# Patient Record
Sex: Female | Born: 1985 | Race: White | Hispanic: No | Marital: Married | Smoking: Never smoker
Health system: Southern US, Community
[De-identification: ages and names within clinical notes are randomized; demographics above are authoritative.]

## PROBLEM LIST (undated history)

## (undated) DIAGNOSIS — E039 Hypothyroidism, unspecified: Secondary | ICD-10-CM

## (undated) HISTORY — PX: TONSILLECTOMY: SUR1361

## (undated) HISTORY — PX: INDUCED ABORTION: SHX677

---

## 2008-12-15 ENCOUNTER — Ambulatory Visit: Payer: Self-pay

## 2011-06-30 ENCOUNTER — Emergency Department: Payer: Self-pay | Admitting: Emergency Medicine

## 2011-06-30 LAB — COMPREHENSIVE METABOLIC PANEL
Alkaline Phosphatase: 51 U/L (ref 50–136)
BUN: 14 mg/dL (ref 7–18)
Bilirubin,Total: 0.4 mg/dL (ref 0.2–1.0)
Calcium, Total: 9.3 mg/dL (ref 8.5–10.1)
Co2: 29 mmol/L (ref 21–32)
EGFR (African American): >60
EGFR (Non-African Amer.): >60
Glucose: 103 mg/dL — ABNORMAL HIGH (ref 65–99)
Potassium: 3.7 mmol/L (ref 3.5–5.1)
SGOT(AST): 31 U/L (ref 15–37)
Sodium: 142 mmol/L (ref 136–145)
Total Protein: 8.6 g/dL — ABNORMAL HIGH (ref 6.4–8.2)

## 2011-06-30 LAB — CBC
HCT: 40 % (ref 35.0–47.0)
MCH: 31.3 pg (ref 26.0–34.0)
MCHC: 33.7 g/dL (ref 32.0–36.0)
MCV: 93 fL (ref 80–100)
Platelet: 264 10*3/uL (ref 150–440)
RDW: 13 % (ref 11.5–14.5)
WBC: 13.3 10*3/uL — ABNORMAL HIGH (ref 3.6–11.0)

## 2011-06-30 LAB — URINALYSIS, COMPLETE
Bacteria: NONE SEEN
Bilirubin,UR: NEGATIVE
Blood: NEGATIVE
Glucose,UR: NEGATIVE mg/dL (ref 0–75)
Leukocyte Esterase: NEGATIVE
Nitrite: NEGATIVE
Protein: NEGATIVE
Specific Gravity: 1.026 (ref 1.003–1.030)

## 2014-07-10 ENCOUNTER — Other Ambulatory Visit: Payer: Self-pay | Admitting: Family Medicine

## 2014-07-10 ENCOUNTER — Ambulatory Visit
Admission: RE | Admit: 2014-07-10 | Discharge: 2014-07-10 | Disposition: A | Discharge status: Home / Self Care | Payer: No Typology Code available for payment source | Source: Ambulatory Visit | Attending: Family Medicine | Admitting: Family Medicine

## 2014-07-10 DIAGNOSIS — M5489 Other dorsalgia: Secondary | ICD-10-CM

## 2014-08-04 ENCOUNTER — Other Ambulatory Visit: Payer: Self-pay | Admitting: Family Medicine

## 2014-08-04 DIAGNOSIS — M546 Pain in thoracic spine: Secondary | ICD-10-CM

## 2014-08-12 ENCOUNTER — Ambulatory Visit
Admission: RE | Admit: 2014-08-12 | Discharge: 2014-08-12 | Disposition: A | Discharge status: Home / Self Care | Payer: No Typology Code available for payment source | Source: Ambulatory Visit | Attending: Family Medicine | Admitting: Family Medicine

## 2014-08-12 DIAGNOSIS — M546 Pain in thoracic spine: Secondary | ICD-10-CM

## 2014-08-24 ENCOUNTER — Encounter: Payer: Self-pay | Admitting: Physical Therapy

## 2014-08-24 ENCOUNTER — Ambulatory Visit: Payer: No Typology Code available for payment source | Attending: Family Medicine | Admitting: Physical Therapy

## 2014-08-24 DIAGNOSIS — M546 Pain in thoracic spine: Secondary | ICD-10-CM | POA: Insufficient documentation
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
## 2014-08-24 NOTE — Patient Instructions (Addendum)
Seated Spiral Stretch   Sit with straight back, crossing left leg over bent right leg. Hold left knee with right hand and spiral to left. Support position with left hand on floor. Hold for _30__seconds. Repeat on other side. 2 times 2 times per day ADVANCED: Grasp heel of right foot.  Copyright  VHI. All rights reserved.  Patient able to return demonstration correctly

## 2014-08-26 ENCOUNTER — Encounter: Payer: Self-pay | Admitting: Physical Therapy

## 2014-08-26 ENCOUNTER — Ambulatory Visit: Payer: No Typology Code available for payment source | Admitting: Physical Therapy

## 2014-08-26 DIAGNOSIS — M546 Pain in thoracic spine: Secondary | ICD-10-CM | POA: Diagnosis not present

## 2014-08-26 NOTE — Therapy (Signed)
[facility] [address], [address] [place], [place], [address] Phone: [phone]   Fax:  [phone]  Physical Therapy Treatment  Patient Details  Name: Katherine Graham MRN: [id number] Date of Birth: 1986-02-19 Referring Provider:  Farris HasMorrow, Aaron, MD  Encounter Date: 08/26/2014      PT End of Session - 08/26/14 1309    Visit Number 2   Date for PT Re-Evaluation 09/21/14   PT Start Time 1233   PT Stop Time 1315   PT Time Calculation (min) 42 min   Activity Tolerance Patient tolerated treatment well   Behavior During Therapy [facility] for tasks assessed/performed      History reviewed. No pertinent past medical history.  History reviewed. No pertinent past surgical history.  There were no vitals filed for this visit.  Visit Diagnosis:  Left-sided thoracic back pain      Subjective Assessment - 08/26/14 1236    Subjective More pain ( slightly) since doing stretches.    Currently in Pain? Yes   Pain Score 3    Pain Location Back   Pain Orientation Left;Lower;Mid   Pain Descriptors / Indicators Sore   Pain Type Chronic pain   Aggravating Factors  Leaning left, laying on stomach   Pain Relieving Factors Laying on back                         [facility] Treatment/Exercise - 08/26/14 0001    Lumbar Exercises: Supine   Ab Set --  TA contractions 10x                PT Education - 08/26/14 1254    Education provided Yes   Education Details HEP. posture, to try eliptical at gym, no free wts, light mackines ok if she uses her abs.   Person(s) Educated Patient   Methods Explanation;Demonstration;Tactile cues;Verbal cues;Handout   Comprehension Verbalized understanding;Returned demonstration             PT Long Term Goals - 08/26/14 1312    PT LONG TERM GOAL #1   Title sleep with pain decreased >/= 75%   Time 4   Period Weeks   Status New  First tx day.    PT LONG TERM GOAL #2    Title sit erect without pain due to improved mobility of the thoracic spine   Period Weeks   Status New   PT LONG TERM GOAL #3   Title full thoracic motion for rotation and sidebending   Time 4   Status New               Plan - 08/26/14 1310    Clinical Impression Statement Pt educated in posture in standing and sitting at school, discussed MRI resluts and the meaning behind. Inititated thoracic extension in standing with sheet for HEP and to do at work.    Pt will benefit from skilled therapeutic intervention in order to improve on the following deficits Decreased range of motion;Impaired flexibility;Postural dysfunction;Decreased activity tolerance;Increased fascial restricitons;Pain;Increased muscle spasms;Decreased mobility   PT Frequency 2x / week   PT Duration 4 weeks   PT Treatment/Interventions Moist Heat;Therapeutic activities;Patient/family education;Ultrasound;Electrical Stimulation;Neuromuscular re-education;Manual techniques;Therapeutic exercise;Cryotherapy;Passive range of motion;ADLs/Self Care Home Management   PT Next Visit Plan Review HEP and postural concepts, begin gentle thoarcic stabs.    Consulted and Agree with Plan of Care Patient        Problem List There are no active  problems to display for this patient.   Samyra Limb, PTA 08/26/2014, 1:14 PM  [facility] [address], [address] [place], [place], [address] Phone: [phone]   Fax:  [phone]

## 2014-08-26 NOTE — Patient Instructions (Signed)
   Take long towel or sheet, in standing, place towel around tight or painful area on back. Pull sheet forward as you gently arch back and look to the sky. DO 5x 1-3 x day. Marland Kitchen.    Posture Tips DO: - stand tall and erect - keep chin tucked in - keep head and shoulders in alignment - check posture regularly in mirror or large window - pull head back against headrest in car seat;  Change your position often.  Sit with lumbar support. DON'T: - slouch or slump while watching TV or reading - sit, stand or lie in one position  for too long;  Sitting is especially hard on the spine so if you sit at a desk/use the computer, then stand up often!   Copyright  VHI. All rights reserved.  Posture - Standing   Good posture is important. Avoid slouching and forward head thrust. Maintain curve in low back and align ears over shoul- ders, hips over ankles.  Pull your belly button in toward your back bone.   Copyright  VHI. All rights reserved.  Posture - Sitting   Sit upright, head facing forward. Try using a roll to support lower back. Keep shoulders relaxed, and avoid rounded back. Keep hips level with knees. Avoid crossing legs for long periods.   Copyright  VHI. All rights reserved.   Copyright  VHI. All rights reserved.

## 2014-09-02 ENCOUNTER — Ambulatory Visit: Payer: No Typology Code available for payment source

## 2014-09-02 ENCOUNTER — Encounter: Payer: No Typology Code available for payment source | Admitting: Physical Therapy

## 2014-09-02 DIAGNOSIS — M546 Pain in thoracic spine: Secondary | ICD-10-CM

## 2014-09-02 DIAGNOSIS — R29898 Other symptoms and signs involving the musculoskeletal system: Secondary | ICD-10-CM

## 2014-09-02 NOTE — Therapy (Signed)
[facility] [address], [address] [place], [place], [address] Phone: [phone]   Fax:  [phone]  Physical Therapy Treatment  Patient Details  Name: Katherine Graham MRN: [id number] Date of Birth: May 07, 1986 Referring Provider:  Farris Has, MD  Encounter Date: 09/02/2014      PT End of Session - 09/02/14 1223    Visit Number 3   Date for PT Re-Evaluation 09/21/14   PT Start Time 1150   PT Stop Time 1238   PT Time Calculation (min) 48 min   Activity Tolerance Patient tolerated treatment well   Behavior During Therapy [facility] for tasks assessed/performed      History reviewed. No pertinent past medical history.  History reviewed. No pertinent past surgical history.  There were no vitals filed for this visit.  Visit Diagnosis:  Left-sided thoracic back pain  Weakness of back      Subjective Assessment - 09/02/14 1153    Subjective Thoracic spine feels sore today after taking a test this morning.   Pain Score 4    Pain Location Back   Pain Orientation Upper;Mid;Left   Pain Descriptors / Indicators Sore   Pain Type Chronic pain   Pain Onset More than a month ago   Pain Frequency Intermittent   Aggravating Factors  Sitting for long periods, sitting with bad posture   Pain Relieving Factors laying on back, heat   Multiple Pain Sites No                         [facility] Adult PT Treatment/Exercise - 09/02/14 0001    Exercises   Exercises Shoulder;Neck   Neck Exercises: Machines for Strengthening   UBE (Upper Arm Bike) Level 1: reverse x 6 minutes  verbal cues for posture   Lumbar Exercises: Seated   Other Seated Lumbar Exercises thoracolumbar rotation 3x20 seconds   Lumbar Exercises: Quadruped   Other Quadruped Lumbar Exercises prayer stretch: forward 2x 20 seconds, lateral to right 2x20 seconds   Shoulder Exercises: Supine   Horizontal ABduction Strengthening;Both;20 reps;Theraband   supine on foam roll for decompression   Theraband Level (Shoulder Horizontal ABduction) Level 1 (Yellow)   Shoulder Exercises: Standing   Extension Strengthening;Both;20 reps;Theraband   Theraband Level (Shoulder Extension) Level 1 (Yellow)   Modalities   Modalities Moist Heat   Moist Heat Therapy   Number Minutes Moist Heat 15 Minutes   Moist Heat Location Other (comment)  thoracic/lumbar                PT Education - 09/02/14 1206    Education provided Yes   Education Details HEP: Yellow theraband bilateral shoulder extension, horizontal abduction in supine, thoracolumbar rotation in sitting   Person(s) Educated Patient   Methods Explanation;Demonstration;Handout   Comprehension Verbalized understanding             PT Long Term Goals - 09/02/14 1157    PT LONG TERM GOAL #1   Title sleep with pain decreased >/= 75%   Time 4   Period Weeks   Status On-going  No significant change in pain   PT LONG TERM GOAL #2   Title sit erect without pain due to improved mobility of the thoracic spine   Time 4   Period Weeks   Status On-going  Pt is working on it and reports postural modifications               Plan - 09/02/14  1213    Clinical Impression Statement Pt with weakness in the thoracic spine and difficulty in upright posture.  Pt will benefit from strength and flexibility to improve posture to reduce pain.     Pt will benefit from skilled therapeutic intervention in order to improve on the following deficits Decreased range of motion;Impaired flexibility;Postural dysfunction;Decreased activity tolerance;Increased fascial restricitons;Pain;Increased muscle spasms;Decreased mobility   Rehab Potential Excellent   PT Frequency 2x / week   PT Duration 4 weeks   PT Treatment/Interventions Moist Heat;Therapeutic activities;Patient/family education;Ultrasound;Electrical Stimulation;Neuromuscular re-education;Manual techniques;Therapeutic  exercise;Cryotherapy;Passive range of motion;ADLs/Self Care Home Management   PT Next Visit Plan continue thoracic strength and flexiblity, modalities as needed.     Consulted and Agree with Plan of Care Patient        Problem List There are no active problems to display for this patient.   Angelize Ryce, PT 09/02/2014, 12:25 PM  [facility] [address], [address] [place], [place], [address] Phone: [phone]   Fax:  [phone]

## 2014-09-02 NOTE — Patient Instructions (Addendum)
Resisted Horizontal Abduction: Bilateral   On your back , tubing in both hands, arms out in front. Keeping arms straight, pinch shoulder blades together and stretch arms out. Repeat _10___ times per set. Do 2____ sets per session. Do _1-2___ sessions per day.  Strengthening: Resisted Extension   Both arms at the same Pull arm back, elbow straight. Repeat _10___ times per set. Do _2___ sets per session. Do _1-2___ sessions per day.  Can place band around the front of your body and perform with both arms at the same time.  Cervico-Thoracic: Extension / Rotation (Sitting)   Reach across body with left arm and grasp back of chair. Gently look over right side shoulder. Hold _20___ seconds. Relax. Repeat _3___ times per set. Do _1___ sets per session. Do 2-3____ sessions per day.  http://orth.exer.us/981   Copyright  VHI. All rights reserved.

## 2014-09-09 ENCOUNTER — Encounter: Payer: Self-pay | Admitting: Physical Therapy

## 2014-09-09 ENCOUNTER — Ambulatory Visit: Payer: No Typology Code available for payment source | Attending: Family Medicine | Admitting: Physical Therapy

## 2014-09-09 DIAGNOSIS — M546 Pain in thoracic spine: Secondary | ICD-10-CM | POA: Insufficient documentation

## 2014-09-09 DIAGNOSIS — R29898 Other symptoms and signs involving the musculoskeletal system: Secondary | ICD-10-CM

## 2014-09-09 NOTE — Therapy (Addendum)
[facility] [address] [place], [place], [address] Phone: [phone]   Fax:  [phone]  Physical Therapy Treatment  Patient Details  Name: Katherine Graham MRN: [id number] Date of Birth: 03/22/1986 Referring Provider:  London Pepper, MD  Encounter Date: 09/09/2014      PT End of Session - 09/09/14 0821    Visit Number 4   Date for PT Re-Evaluation 09/21/14   PT Start Time 0802   PT Stop Time 0902   PT Time Calculation (min) 60 min   Activity Tolerance Patient tolerated treatment well   Behavior During Therapy [facility] for tasks assessed/performed      History reviewed. No pertinent past medical history.  History reviewed. No pertinent past surgical history.  There were no vitals filed for this visit.  Visit Diagnosis:  Left-sided thoracic back pain  Weakness of back      Subjective Assessment - 09/09/14 0803    Subjective Pt reports it thoracic spine feels better, but after work the back is very stiff   Limitations Sitting;Standing   How long can you sit comfortably? sit comformtably but  not with back straight, has to hunch over onto elbows   Currently in Pain? Yes   Pain Score 3   up to 5-6 in the early morning or late at night   Pain Location Back   Pain Orientation Left;Mid;Upper   Multiple Pain Sites No                         [facility] Adult PT Treatment/Exercise - 09/09/14 0001    Neck Exercises: Machines for Strengthening   UBE (Upper Arm Bike) Level 1: reverse x 6 minutes  verbal cues for posture   Lumbar Exercises: Seated   Other Seated Lumbar Exercises thoracolumbar rotation 3x20 seconds   Lumbar Exercises: Supine   Other Supine Lumbar Exercises Foam roll x 2 min with bil UE into fllexion x 10   Lumbar Exercises: Quadruped   Other Quadruped Lumbar Exercises prayer stretch: forward 2x 20 seconds, lateral to right 2x20 seconds   Other Quadruped Lumbar Exercises prayer  stretch prolonged hold x 2 min   Shoulder Exercises: Seated   Other Seated Exercises Hero pose Cat and Camel 2x10   Modalities   Modalities Moist Heat   Moist Heat Therapy   Number Minutes Moist Heat 15 Minutes   Moist Heat Location Other (comment)  thoracic/lumbar                PT Education - 09/09/14 [phone]    Education provided Yes   Education Details handed pt red t-band for HEP shoulder extension, abduction in supine   Person(s) Educated Patient   Methods Explanation;Demonstration;Handout   Comprehension Verbalized understanding             PT Long Term Goals - 09/09/14 0825    PT LONG TERM GOAL #1   Title sleep with pain decreased >/= 75%  35to40% improved   Time 4   Period Weeks   Status On-going   PT LONG TERM GOAL #2   Title sit erect without pain due to improved mobility of the thoracic spine   Time 4   Period Weeks   Status On-going   PT LONG TERM GOAL #3   Title full thoracic motion for rotation and sidebending   Time 4   Period Weeks   Status Achieved  Plan - 09/09/14 8412    Clinical Impression Statement Pt with weakness and stiffness in the thoracic spine and difficulty in uprigth posture. Pt will benefit from PT to continue to establish HEP   Pt will benefit from skilled therapeutic intervention in order to improve on the following deficits Decreased range of motion;Impaired flexibility;Postural dysfunction;Decreased activity tolerance;Increased fascial restricitons;Pain;Increased muscle spasms;Decreased mobility   PT Frequency 2x / week   PT Duration 4 weeks   PT Treatment/Interventions Moist Heat;Therapeutic activities;Patient/family education;Ultrasound;Electrical Stimulation;Neuromuscular re-education;Manual techniques;Therapeutic exercise;Cryotherapy;Passive range of motion;ADLs/Self Care Home Management   PT Next Visit Plan D/C to HEP   PT Home Exercise Plan continue with current HEP   Consulted and Agree with Plan  of Care Patient        Problem List There are no active problems to display for this patient.   NAUMANN-HOUEGNIFIO,Katherine Graham PTA 09/09/2014, 8:39 AM   PHYSICAL THERAPY DISCHARGE SUMMARY  Visits from Start of Care: 4  Current functional level related to goals / functional outcomes: See above for goal assessment.  Pt didn't show for her final PT session.  PT called and pt reported that she was doing well and didn't need PT at this time.     Remaining deficits: No deficits per pt report.  Pt will follow-up with MD as needed.     Education / Equipment: HEP, posture Plan: Patient agrees to discharge.  Patient goals were partially met. Patient is being discharged due to being pleased with the current functional level.  ?????   Sigurd Sos, PT 09/21/2014 9:17 AM  [facility] [address], [place], [place], [address] Phone: [phone]   Fax:  [phone]

## 2014-09-21 ENCOUNTER — Ambulatory Visit: Payer: No Typology Code available for payment source

## 2014-10-15 ENCOUNTER — Telehealth: Payer: Self-pay | Admitting: *Deleted

## 2014-10-15 NOTE — Telephone Encounter (Signed)
Ok thanks 

## 2014-10-15 NOTE — Telephone Encounter (Addendum)
Pt states she is scheduled for a laser wart procedure 10/16/2014 at 0915am, and is scared.  I left message informing pt there are many options for pre-op anesthesia, and many options for treatment of warts and Dr. Ardelle Anton would discuss at the appt.  I called pt again and discussed the option of the acid to the warts or the curettage of the warts and the cold spray prior to any anesthetic and told pt post-op numbness usually lasted 4-6 hours and afterward could be managed with antiinflammatory medication or Tylenol.  I told pt we did not do laser treatment in the office for warts, on same day.

## 2014-10-16 ENCOUNTER — Ambulatory Visit (INDEPENDENT_AMBULATORY_CARE_PROVIDER_SITE_OTHER): Payer: No Typology Code available for payment source | Admitting: Podiatry

## 2014-10-16 VITALS — Ht 68.0 in | Wt 167.0 lb

## 2014-10-16 DIAGNOSIS — B078 Other viral warts: Secondary | ICD-10-CM

## 2014-10-16 DIAGNOSIS — B079 Viral wart, unspecified: Secondary | ICD-10-CM

## 2014-10-16 NOTE — Progress Notes (Signed)
   Subjective:    Patient ID: Katherine Graham, female    DOB: 21-Oct-1985, 29 y.o.   MRN: [id number]  HPI 29 year-old female presents the office today for complaints of multiple warts to both of her feet. She states that she presented seen Dr. Elijah Birk who is frozen the areas without much resolution. States that she has pain in the area with pressure and weightbearing. She denies any redness or drainage from the sites. Denies any history of injury or trauma. No other complaints at this time.   Review of Systems  All other systems reviewed and are negative.      Objective:   Physical Exam AAO x3, NAD; appears anxious DP/PT pulses palpable bilaterally, CRT less than 3 seconds Protective sensation intact with Simms Weinstein monofilament, vibratory sensation intact, Achilles tendon reflex intact On the plantar aspect of the feet or multiple, annular hyperkeratotic lesions. Upon debridement a lesion there is pinpoint bleeding and evidence of verruca. The lesions are on the right foot submetatarsal 2, 4, 5, plantar heel, left submetatarsal 3, 5, heel 2. There is tenderness palpation gently upon these lesions. No other areas of tenderness to bilateral lower extremities. MMT 5/5, ROM WNL.  No open lesions or pre-ulcerative lesions.  No overlying edema, erythema, increase in warmth to bilateral lower extremities.  No pain with calf compression, swelling, warmth, erythema bilaterally.      Assessment & Plan:  29 year old female with multiple bilateral foot verruca -Treatment options discussed including all alternatives, risks, and complications -At today's appointment the lesions were debrided without complications to reveal underlying verruca. Cantharone was applied followed by an occlusive bandage. Patient desired to have all the patient's treated today's appointment she has several days off of work to recover. Post procedure sections were discussed the patient for which she  understands. -Discussed laser therapy in the near future if warts remaining. -Follow up in 2 weeks or sooner if any problems are to arise. In the meantime I encouraged and call the office with any questions, concerns, change in symptoms.

## 2014-10-30 ENCOUNTER — Ambulatory Visit: Payer: No Typology Code available for payment source | Admitting: Podiatry

## 2015-07-09 DIAGNOSIS — G43009 Migraine without aura, not intractable, without status migrainosus: Secondary | ICD-10-CM | POA: Insufficient documentation

## 2015-07-09 DIAGNOSIS — Z0000 Encounter for general adult medical examination without abnormal findings: Secondary | ICD-10-CM | POA: Diagnosis not present

## 2015-07-09 DIAGNOSIS — R0602 Shortness of breath: Secondary | ICD-10-CM | POA: Diagnosis not present

## 2015-07-09 DIAGNOSIS — K219 Gastro-esophageal reflux disease without esophagitis: Secondary | ICD-10-CM | POA: Diagnosis not present

## 2015-07-09 DIAGNOSIS — M25562 Pain in left knee: Secondary | ICD-10-CM | POA: Diagnosis not present

## 2015-07-12 DIAGNOSIS — M2550 Pain in unspecified joint: Secondary | ICD-10-CM | POA: Diagnosis not present

## 2015-07-12 DIAGNOSIS — Z13220 Encounter for screening for lipoid disorders: Secondary | ICD-10-CM | POA: Diagnosis not present

## 2015-07-12 DIAGNOSIS — Z79899 Other long term (current) drug therapy: Secondary | ICD-10-CM | POA: Diagnosis not present

## 2015-07-12 DIAGNOSIS — K219 Gastro-esophageal reflux disease without esophagitis: Secondary | ICD-10-CM | POA: Diagnosis not present

## 2015-07-13 DIAGNOSIS — Z315 Encounter for genetic counseling: Secondary | ICD-10-CM | POA: Diagnosis not present

## 2015-07-13 DIAGNOSIS — Z1379 Encounter for other screening for genetic and chromosomal anomalies: Secondary | ICD-10-CM | POA: Diagnosis not present

## 2015-07-13 DIAGNOSIS — Z01419 Encounter for gynecological examination (general) (routine) without abnormal findings: Secondary | ICD-10-CM | POA: Diagnosis not present

## 2015-07-13 MED FILL — CLARITHROMYCIN 500 MG TAB: 500 | 14 days supply | Qty: 28 | Fill #0

## 2015-07-13 MED FILL — LANSOPRAZOLE DR 30 MG CAP: 30 | 14 days supply | Qty: 28 | Fill #0

## 2015-07-13 MED FILL — AMOXICILLIN 500 MG CAPSULE: 500 | 14 days supply | Qty: 56 | Fill #0

## 2015-07-13 MED FILL — LO LOESTRIN FE 1-10 TABLET: 1 MG-10 MCG | 84 days supply | Qty: 84 | Fill #0

## 2015-08-30 DIAGNOSIS — M542 Cervicalgia: Secondary | ICD-10-CM | POA: Diagnosis not present

## 2015-08-30 DIAGNOSIS — R202 Paresthesia of skin: Secondary | ICD-10-CM | POA: Diagnosis not present

## 2015-08-30 MED FILL — predniSONE 10 MG TABS: 10 | 6 days supply | Qty: 21 | Fill #0

## 2015-08-30 MED FILL — CYCLOBENZAPRINE 10 MG TAB: 10 | 30 days supply | Qty: 30 | Fill #0

## 2015-09-06 DIAGNOSIS — M549 Dorsalgia, unspecified: Secondary | ICD-10-CM | POA: Diagnosis not present

## 2015-09-09 DIAGNOSIS — M5412 Radiculopathy, cervical region: Secondary | ICD-10-CM | POA: Diagnosis not present

## 2015-09-09 MED FILL — MELOXICAM 15 MG TABLET: 15 | 30 days supply | Qty: 30 | Fill #0

## 2015-09-09 MED FILL — GABAPENTIN 100 MG CAPSULE: 100 | 60 days supply | Qty: 60 | Fill #0

## 2015-09-20 ENCOUNTER — Encounter: Payer: Self-pay | Admitting: Physical Therapy

## 2015-09-20 ENCOUNTER — Ambulatory Visit: Payer: 59 | Attending: Orthopedic Surgery | Admitting: Physical Therapy

## 2015-09-20 DIAGNOSIS — M542 Cervicalgia: Secondary | ICD-10-CM | POA: Diagnosis not present

## 2015-09-20 NOTE — Addendum Note (Signed)
Addended by: Ezekiel InaMANSFIELD, Remi Rester S on: 09/20/2015 02:28 PM   Modules accepted: Orders

## 2015-09-20 NOTE — Therapy (Signed)
[facility] MAIN [facility] SERVICES [address] [place], [place], [address] Phone: [phone]   Fax:  [phone]  Physical Therapy Evaluation  Patient Details  Name: Katherine Graham MRN: [id number] Date of Birth: Apr 25, 1986 Referring Provider: Dedra Skeens  Encounter Date: 09/20/2015    History reviewed. No pertinent past medical history.  History reviewed. No pertinent past surgical history.  There were no vitals filed for this visit.       Subjective Assessment - 09/20/15 0907    Subjective Patient is having neck pain for 2 weeks and it has been off and on for the last 8 months.    Currently in Pain? Yes  upper trap   Pain Score 6    Pain Location Neck            [facility] PT Assessment - 09/20/15 0001    Assessment   Medical Diagnosis pinched nerve   Referring Provider MUNDY, TODD   Onset Date/Surgical Date 09/06/15   Hand Dominance Left   Next MD Visit 09/23/15   Prior Therapy PT   Precautions   Precautions None   Restrictions   Weight Bearing Restrictions No   Balance Screen   Has the patient fallen in the past 6 months No   Has the patient had a decrease in activity level because of a fear of falling?  Yes   Is the patient reluctant to leave their home because of a fear of falling?  No   Home Tourist information centre manager residence   Living Arrangements Spouse/significant other   Available Help at Discharge Family   Type of Home House   Home Access Stairs to enter   Entrance Stairs-Number of Steps 2   Entrance Stairs-Rails Right   Home Layout One level   Home Equipment None   Prior Function   Level of Independence Independent   Vocation Unemployed   Leisure work out   Copy Status Within Functional Limits for tasks assessed       PAIN: 7/10 to upper trap, thoracic pain with   POSTURE: WFL   PROM/AROM: Pain with Rotation left and SB left  Cervical flex 70 deg Ext  50  deg SBL  55 deg SBR 40 deg Rotation left 70 deg Rotation right 60 deg  STRENGTH:  Graded on a 0-5 scale Muscle Group Left Right  Shoulder flex 5 5  Shoulder Abd 5 5  Shoulder Ext 5 5  Shoulder IR/ER 5 5  Elbow 5 5  Wrist/hand 5 5  Hip Flex    Hip Abd    Hip Add    Hip Ext    Hip IR/ER    Knee Flex    Knee Ext    Ankle DF    Ankle PF     SENSATION: intact   SPECIAL TESTS: + spring test T 1- T 10   OUTCOME MEASURES: TEST Outcome Interpretation  NDI 38 % Moderate disabiity                                                       Patient will benefit from skilled therapeutic intervention in order to improve the following deficits and impairments:     Visit Diagnosis: No diagnosis found.     Problem List There  are no active problems to display for this patient. Ezekiel InaKristine S Gweneth Fredlund, PT, DPT  [place] AF[place], Barkley BrunsKristine S 09/20/2015, 9:16 AM  [facility] [address] [place], [place], [address] Phone: [phone]   Fax:  [phone]  Name: Katherine Graham MRN: [id number] Date of Birth: [birth year]

## 2015-09-22 ENCOUNTER — Ambulatory Visit: Payer: 59 | Admitting: Physical Therapy

## 2015-09-22 ENCOUNTER — Encounter: Payer: Self-pay | Admitting: Physical Therapy

## 2015-09-22 DIAGNOSIS — M542 Cervicalgia: Secondary | ICD-10-CM | POA: Diagnosis not present

## 2015-09-22 NOTE — Therapy (Signed)
[facility] MAIN [facility] SERVICES [address] [place], [place], [address] Phone: [phone]   Fax:  [phone]  Physical Therapy Treatment  Patient Details  Name: Katherine Graham MRN: [id number] Date of Birth: 12/02/1985 Referring Provider: Dedra Skeens  Encounter Date: 09/22/2015      PT End of Session - 09/22/15 0923    Visit Number 2   Number of Visits 17   PT Start Time 0915   PT Stop Time 1000   PT Time Calculation (min) 45 min   Activity Tolerance Patient tolerated treatment well      History reviewed. No pertinent past medical history.  History reviewed. No pertinent past surgical history.  There were no vitals filed for this visit.      Subjective Assessment - 09/22/15 0921    Subjective Patient is having neck pain 3/10 and no numbness in her left hand/fingers.    Pertinent History one year ago thoracic pain and PT   Limitations Sitting   Patient Stated Goals to be able to work out and not have pain      Cervical traction intermittent hold x 30 sec on 10 sec off 20-22 lbs followed by:  Manual therapy including PA glides throacic spine and cervical spine grade 2 and 3 x 30 bouts  OA release x 2 minutes   AROM slowly to end range SB left and right , rotation left and right Repeated cervical extension into end range Patient has no pain following therapy and had 3/10 pain to right rotation and right SB                            PT Education - 09/22/15 0922    Education provided Yes   Education Details continue chin tucks and scapula retraction with RTB   Person(s) Educated Patient   Comprehension Verbalized understanding             PT Long Term Goals - 09/20/15 1230    PT LONG TERM GOAL #1   Title sleep with pain decreased >/= 75%   Time 8   Period Weeks   Status New   PT LONG TERM GOAL #2   Title Patient will be able to turn her head to the right with 50% less pain to be able  to drive a car.    Time 8   Period Weeks   Status New   PT LONG TERM GOAL #3   Title Patient will be able to perform ADL's with 50 % less pain to neck.   Time 8   Period Weeks   Status New               Plan - 09/22/15 3295    Clinical Impression Statement Patient continues to have intermittent pain with the highest of 9/10 and the last week the highest is a 7/10   Rehab Potential Good   PT Frequency 2x / week   PT Duration 8 weeks   PT Treatment/Interventions Manual techniques;Therapeutic activities;Therapeutic exercise;Cryotherapy;Electrical Stimulation;Iontophoresis /ml Dexamethasone;Moist Heat;Ultrasound;Dry needling;Traction   PT Next Visit Plan posture and  strengthening, traction      Patient will benefit from skilled therapeutic intervention in order to improve the following deficits and impairments:  Pain, Decreased range of motion, Decreased strength, Decreased mobility, Impaired flexibility  Visit Diagnosis: Cervicalgia     Problem List There are no active problems to display for this patient. Barkley Bruns  Octavia HeirS Manjinder Breau, PT, DPT  [place], Shalla Bulluck S 09/22/2015, 10:19 AM  [place] [facility] [address] [place], [place], [address] Phone: [phone]   Fax:  [phone]  Name: Katherine Graham MRN: [id number] Date of Birth: Dec 11, 1985

## 2015-09-23 DIAGNOSIS — M5412 Radiculopathy, cervical region: Secondary | ICD-10-CM | POA: Diagnosis not present

## 2015-09-29 ENCOUNTER — Ambulatory Visit: Payer: 59 | Admitting: Physical Therapy

## 2015-09-29 ENCOUNTER — Encounter: Payer: Self-pay | Admitting: Physical Therapy

## 2015-09-29 DIAGNOSIS — M542 Cervicalgia: Secondary | ICD-10-CM | POA: Diagnosis not present

## 2015-09-29 NOTE — Therapy (Signed)
[place] [facility] MAIN [facility] SERVICES [address] [place], [place], [address] Phone: [phone]   Fax:  [phone]  Physical Therapy Treatment  Patient Details  Name: Katherine Graham MRN: [id number] Date of Birth: 05/02/1986 Referring Provider: Dedra Skeens  Encounter Date: 09/29/2015      PT End of Session - 09/29/15 0939    Visit Number 3   Number of Visits 17   PT Start Time 0915   PT Stop Time 1000   PT Time Calculation (min) 45 min   Activity Tolerance Patient tolerated treatment well      History reviewed. No pertinent past medical history.  History reviewed. No pertinent past surgical history.  There were no vitals filed for this visit.      Subjective Assessment - 09/29/15 0921    Subjective Patient is having neck pain 3610 and no numbness in her left hand/fingers. She worked at [employer] on [employer] for 3 hours and the pain was 8/10 thru tuesday night .    Pertinent History one year ago thoracic pain and PT   Limitations Sitting   Patient Stated Goals to be able to work out and not have pain     Manual therapy including OA release x 5 minutes T1-10 PA glides grade 3 x 30 bouts C 6, C 7 PA glides grade 3 x 30 bouts  Following manual therapy; cervical traction x 15 mins interval 20 sec on 5 sec off 25 lbs  Therapeutic exercise; Chin tucks reviewed, scapula retraction reviewed Prone scapula retraction 0 weight x 20 Prone T exercises ( mid trap )  x 20 0 weight Prone I exercise ( lower trap) x 20 reps 0 weight   Patient has 0/10 following manual therapy, exercise, and mechanical traction.  Patient was instructed in ice and HEP.                             PT Education - 09/29/15 6962    Education provided (p) Yes   Person(s) Educated (p) Patient   Methods (p) Explanation   Comprehension (p) Verbalized understanding             PT Long Term Goals - 09/20/15 1230    PT LONG TERM GOAL  #1   Title sleep with pain decreased >/= 75%   Time 8   Period Weeks   Status New   PT LONG TERM GOAL #2   Title Patient will be able to turn her head to the right with 50% less pain to be able to drive a car.    Time 8   Period Weeks   Status New   PT LONG TERM GOAL #3   Title Patient will be able to perform ADL's with 50 % less pain to neck.   Time 8   Period Weeks   Status New               Plan - 09/29/15 0940    Clinical Impression Statement Patient has pain in neck and shoulder 6/10. Patient has decreased strength to thoracic spine and pain during manula therapy to thoracic spine with relief following  manual therapy.    Rehab Potential Good   PT Frequency 2x / week   PT Duration 8 weeks   PT Treatment/Interventions Manual techniques;Therapeutic activities;Therapeutic exercise;Cryotherapy;Electrical Stimulation;Iontophoresis /ml Dexamethasone;Moist Heat;Ultrasound;Dry needling;Traction   PT Next Visit Plan posture and  strengthening, traction  Patient will benefit from skilled therapeutic intervention in order to improve the following deficits and impairments:  Pain, Decreased range of motion, Decreased strength, Decreased mobility, Impaired flexibility  Visit Diagnosis: Cervicalgia     Problem List There are no active problems to display for this patient. Ezekiel InaKristine S Mardell Cragg, PT, DPT  [place], Barkley BrunsKristine S 09/29/2015, 9:50 AM  [place] [facility] [address] [place], [place], [address] Phone: [phone]   Fax:  [phone]  Name: Katherine Graham MRN: [id number] Date of Birth: 30-Mar-1986

## 2015-09-30 ENCOUNTER — Ambulatory Visit: Payer: 59 | Admitting: Physical Therapy

## 2015-09-30 DIAGNOSIS — M542 Cervicalgia: Secondary | ICD-10-CM

## 2015-09-30 NOTE — Therapy (Signed)
[facility] MAIN [facility] SERVICES [address] [place], [place], [address] Phone: [phone]   Fax:  [phone]  Physical Therapy Treatment  Patient Details  Name: Katherine Graham MRN: [id number] Date of Birth: 10-07-1985 Referring Provider: Dedra Skeens  Encounter Date: 09/30/2015      PT End of Session - 09/30/15 6962    Visit Number 4   Number of Visits 17   PT Start Time 0915   PT Stop Time 1000   PT Time Calculation (min) 45 min   Activity Tolerance Patient tolerated treatment well      No past medical history on file.  No past surgical history on file.  There were no vitals filed for this visit.      Subjective Assessment - 09/30/15 0920    Subjective Patient is having 5/10 pain that has gotten better and is now a 3/10 .    Pertinent History one year ago thoracic pain and PT   Limitations Sitting   Patient Stated Goals to be able to work out and not have pain   Currently in Pain? Yes   Pain Score 3    Pain Location Neck   Pain Orientation Right   Pain Descriptors / Indicators Aching          Manual therapy including OA release x 5 minutes T1-10 PA glides grade 3 x 30 bouts C 6, C 7 PA glides grade 3 x 30 bouts  Following manual therapy; cervical traction x 15 mins interval 20 sec on 5 sec off 25 lbs  Therapeutic exercise; Chin tucks reviewed, scapula retraction reviewed Scapula protraction x 20 Scapula retraction x 20   Patient has 1/10 following manual therapy, exercise, and mechanical traction.  Patient was instructed in ice and HEP                                   PT Education - 09/30/15 0922    Education provided Yes   Education Details neck extension   Person(s) Educated Patient   Methods Explanation   Comprehension Verbalized understanding             PT Long Term Goals - 09/20/15 1230    PT LONG TERM GOAL #1   Title sleep with pain decreased >/= 75%   Time  8   Period Weeks   Status New   PT LONG TERM GOAL #2   Title Patient will be able to turn her head to the right with 50% less pain to be able to drive a car.    Time 8   Period Weeks   Status New   PT LONG TERM GOAL #3   Title Patient will be able to perform ADL's with 50 % less pain to neck.   Time 8   Period Weeks   Status New               Plan - 09/30/15 0943    Clinical Impression Statement Patient has pain in her neck that begins at 3/10 and after manual therapy it decreases with exercise  to 2/10.    Rehab Potential Good   PT Frequency 2x / week   PT Duration 8 weeks   PT Treatment/Interventions Manual techniques;Therapeutic activities;Therapeutic exercise;Cryotherapy;Electrical Stimulation;Iontophoresis /ml Dexamethasone;Moist Heat;Ultrasound;Dry needling;Traction   PT Next Visit Plan posture and  strengthening, traction      Patient will benefit from  skilled therapeutic intervention in order to improve the following deficits and impairments:  Pain, Decreased range of motion, Decreased strength, Decreased mobility, Impaired flexibility  Visit Diagnosis: Cervicalgia     Problem List There are no active problems to display for this patient. Ezekiel InaKristine S Osie Merkin, PT, DPT  [place], Barkley BrunsKristine S 09/30/2015, 9:57 AM  [place] [facility] [address] [place], [place], [address] Phone: [phone]   Fax:  [phone]  Name: Particia JasperSatin Nicole Pooley MRN: [id number] Date of Birth: 03-May-1986

## 2015-10-05 ENCOUNTER — Encounter: Payer: Self-pay | Admitting: Physical Therapy

## 2015-10-05 ENCOUNTER — Ambulatory Visit: Payer: 59 | Admitting: Physical Therapy

## 2015-10-05 DIAGNOSIS — M542 Cervicalgia: Secondary | ICD-10-CM | POA: Diagnosis not present

## 2015-10-05 NOTE — Therapy (Signed)
[place] [facility] [address] [place], [place], [address] Phone: [phone]   Fax:  [phone]  Physical Therapy Treatment  Patient Details  Name: Katherine Graham MRN: [id number] Date of Birth: 07/07/1985 Referring Provider: Dedra SkeensMUNDY, TODD  Encounter Date: 10/05/2015      PT End of Session - 10/05/15 1614    Visit Number 5   Number of Visits 17   PT Start Time 0400   PT Stop Time 0445   PT Time Calculation (min) 45 min   Activity Tolerance Patient tolerated treatment well      History reviewed. No pertinent past medical history.  History reviewed. No pertinent past surgical history.  There were no vitals filed for this visit.      Subjective Assessment - 10/05/15 1613    Subjective Patient is having 5/10 pain.   Pertinent History one year ago thoracic pain and PT   Limitations Sitting   Patient Stated Goals to be able to work out and not have pain      Therapeutic exercise: Reviewed HEP including chin tucks, scapula retraction, cervical extension , AROM cervical   Cervical traction x 20 minutes x 25 lbs on 10 sec and off 5 sec  Patient has no pain following therapy                           PT Education - 10/05/15 1614    Education provided Yes   Education Details neck extension repeated   Person(s) Educated Patient   Methods Explanation             PT Long Term Goals - 09/20/15 1230    PT LONG TERM GOAL #1   Title sleep with pain decreased >/= 75%   Time 8   Period Weeks   Status New   PT LONG TERM GOAL #2   Title Patient will be able to turn her head to the right with 50% less pain to be able to drive a car.    Time 8   Period Weeks   Status New   PT LONG TERM GOAL #3   Title Patient will be able to perform ADL's with 50 % less pain to neck.   Time 8   Period Weeks   Status New               Plan - 10/05/15 1721    Clinical Impression Statement Patient  is able to decreased cervi al pain from 5/10 to 0/10 and now needs to work on correct posture to keep pain from geting worse.    Rehab Potential Good   PT Frequency 2x / week   PT Duration 8 weeks   PT Treatment/Interventions Manual techniques;Therapeutic activities;Therapeutic exercise;Cryotherapy;Electrical Stimulation;Iontophoresis 4mg /ml Dexamethasone;Moist Heat;Ultrasound;Dry needling;Traction   PT Next Visit Plan posture and  strengthening, traction   Consulted and Agree with Plan of Care Patient      Patient will benefit from skilled therapeutic intervention in order to improve the following deficits and impairments:  Pain, Decreased range of motion, Decreased strength, Decreased mobility, Impaired flexibility  Visit Diagnosis: Cervicalgia     Problem List There are no active problems to display for this patient.  Ezekiel InaKristine S [place], PT, DPT [place], Barkley BrunsKristine S 10/05/2015, 5:24 PM  [facility] [address] [place], [place], [address] Phone: [phone]   Fax:  [phone]  Name: Katherine Graham MRN: [id number] Date of Birth: Jun 04, 1985

## 2015-10-05 NOTE — Patient Instructions (Signed)
Axial Extension (Chin Tuck)    Pull chin in and lengthen back of neck. Hold ____ seconds while counting out loud. Repeat ____ times. Do ____ sessions per day.  http://gt2.exer.us/450   Copyright  VHI. All rights reserved.  Neck Side-Bending    Begin with chin level, head centered over spine. Slowly lower one ear toward shoulder. Hold ____ seconds. Slowly return to starting position. Repeat to other side. Repeat ____ times to each side.  Copyright  VHI. All rights reserved.  AROM: Neck Extension    Bend head backward. Hold ____ seconds. Repeat ____ times per set. Do ____ sets per session. Do ____ sessions per day.  http://orth.exer.us/301   Copyright  VHI. All rights reserved.  Extension (Prone)    Lie prone, holding a __ pound ball out in front. Lift chest and ball off floor. Repeat __ times. Rest __ seconds after set. Do __ sets per session.  Copyright  VHI. All rights reserved.  Abduction: Horizontal - Prone (Dumbbell)    Lie with right arm hanging down. Lift arm out to side, thumb up. Repeat ____ times per set. Do ____ sets per session. Do ____ sessions per week. Use ____ lb weight.   Copyright  VHI. All rights reserved.  Extension (Active)    Bring arm back as far as possible. For a greater challenge, do this while lying down on stomach. Repeat _10__ times. Do __1__ sessions per day.  Copyright  VHI. All rights reserved.  Axial Extension (Chin Tuck)    Pull chin in and lengthen back of neck. Hold ____ seconds while counting out loud. Repeat ____ times. Do ____ sessions per day.  http://gt2.exer.us/450   Copyright  VHI. All rights reserved.  AROM: Neck Extension    Bend head backward. Hold ____ seconds. Repeat ____ times per set. Do ____ sets per session. Do ____ sessions per day.  http://orth.exer.us/301   Copyright  VHI. All rights reserved.  Abduction: Horizontal - Prone (Dumbbell)    Lie with right arm hanging down. Lift arm  out to side, thumb up. Repeat ____ times per set. Do ____ sets per session. Do ____ sessions per week. Use ____ lb weight.   Copyright  VHI. All rights reserved.  Extension (Active)    Bring arm back as far as possible. For a greater challenge, do this while lying down on stomach. Repeat ____ times. Do ____ sessions per day.  Copyright  VHI. All rights reserved.  Axial Extension (Chin Tuck)    Pull chin in and lengthen back of neck. Hold ____ seconds while counting out loud. Repeat ____ times. Do ____ sessions per day.  http://gt2.exer.us/450   Copyright  VHI. All rights reserved.  AROM: Neck Extension    Bend head backward. Hold ____ seconds. Repeat ____ times per set. Do ____ sets per session. Do ____ sessions per day.  http://orth.exer.us/301   Copyright  VHI. All rights reserved.  AROM: Neck Extension    Bend head backward. Hold ____ seconds. Repeat ____ times per set. Do ____ sets per session. Do ____ sessions per day.  http://orth.exer.us/301   Copyright  VHI. All rights reserved.

## 2015-10-06 ENCOUNTER — Encounter: Payer: Self-pay | Admitting: Physical Therapy

## 2015-10-06 ENCOUNTER — Ambulatory Visit: Payer: 59 | Admitting: Physical Therapy

## 2015-10-06 DIAGNOSIS — M542 Cervicalgia: Secondary | ICD-10-CM

## 2015-10-06 NOTE — Therapy (Signed)
[place] [facility] MAIN [facility] SERVICES [address] [place], [place], [address] Phone: [phone]   Fax:  [phone]  Physical Therapy Treatment  Patient Details  Name: Katherine Graham MRN: [id number] Date of Birth: Jun 07, 1985 Referring Provider: Dedra Skeens  Encounter Date: 10/06/2015      PT End of Session - 10/06/15 0910    Visit Number 6   Number of Visits 17   PT Start Time 0830   PT Stop Time 0910   PT Time Calculation (min) 40 min   Activity Tolerance Patient tolerated treatment well      History reviewed. No pertinent past medical history.  History reviewed. No pertinent past surgical history.  There were no vitals filed for this visit.      Subjective Assessment - 10/06/15 0909    Subjective Patient is having 7/10 pain.   Pertinent History one year ago thoracic pain and PT   Limitations Sitting   Patient Stated Goals to be able to work out and not have pain      Therapeutic exercise:   + spurlings with SB R   Repeated cervical extension in supine with over pressure to C 6 x 10 x 3 sets Repeated cervical extension in supine with over pressure to C 7 x 10 x 3 sets  Written HEP for seated neck extension every hour that she is awake and ice to cervical spine x 10 mintues Instructed in sleeping position that is comfortable  Patient begins with 7/10 pain and has 1/10 pain after treatment.                           PT Education - 10/06/15 0909    Education provided Yes   Education Details new HEP with neck extension in sitting and ice    Person(s) Educated Patient   Methods Explanation   Comprehension Verbalized understanding             PT Long Term Goals - 09/20/15 1230    PT LONG TERM GOAL #1   Title sleep with pain decreased >/= 75%   Time 8   Period Weeks   Status New   PT LONG TERM GOAL #2   Title Patient will be able to turn her head to the right with 50% less pain to be able  to drive a car.    Time 8   Period Weeks   Status New   PT LONG TERM GOAL #3   Title Patient will be able to perform ADL's with 50 % less pain to neck.   Time 8   Period Weeks   Status New               Plan - 10/06/15 0910    Clinical Impression Statement Patient  was seen for PROM supine extension repeated with over pressure to C 6 and C 7 with pain relief from 7/10 to 1/10.    Rehab Potential Good   PT Frequency 2x / week   PT Duration 8 weeks   PT Treatment/Interventions Manual techniques;Therapeutic activities;Therapeutic exercise;Cryotherapy;Electrical Stimulation;Iontophoresis /ml Dexamethasone;Moist Heat;Ultrasound;Dry needling;Traction   PT Next Visit Plan posture and  strengthening, traction   Consulted and Agree with Plan of Care Patient      Patient will benefit from skilled therapeutic intervention in order to improve the following deficits and impairments:  Pain, Decreased range of motion, Decreased strength, Decreased mobility, Impaired flexibility  Visit Diagnosis:  Cervicalgia     Problem List There are no active problems to display for this patient.  Ezekiel InaKristine S Kursten Kruk, PT, DPT [place], Barkley BrunsKristine S 10/06/2015, 9:35 AM  [place] [facility] [address] [place], [place], [address] Phone: [phone]   Fax:  [phone]  Name: Katherine Graham MRN: [id number] Date of Birth: May 04, 1986

## 2015-10-11 ENCOUNTER — Ambulatory Visit: Payer: 59 | Attending: Orthopedic Surgery | Admitting: Physical Therapy

## 2015-10-11 ENCOUNTER — Encounter: Payer: Self-pay | Admitting: Physical Therapy

## 2015-10-11 DIAGNOSIS — M542 Cervicalgia: Secondary | ICD-10-CM | POA: Insufficient documentation

## 2015-10-11 NOTE — Therapy (Signed)
[place] [facility] MAIN [facility] SERVICES [address] [place], [place], [address] Phone: [phone]   Fax:  [phone]  Physical Therapy Treatment  Patient Details  Name: Katherine Graham MRN: [id number] Date of Birth: 03-31-1986 Referring Provider: Dedra Skeens  Encounter Date: 10/11/2015      PT End of Session - 10/11/15 1523    Visit Number 7   Number of Visits 17   PT Start Time 0345   PT Stop Time 0425   PT Time Calculation (min) 40 min   Activity Tolerance Patient tolerated treatment well      History reviewed. No pertinent past medical history.  History reviewed. No pertinent past surgical history.  There were no vitals filed for this visit.      Subjective Assessment - 10/11/15 1450    Subjective Patient is having 4/10 pain.   Pertinent History one year ago thoracic pain and PT   Limitations Sitting   Patient Stated Goals to be able to work out and not have pain   Pain Score 4    Pain Location Neck   Pain Descriptors / Indicators Aching      Patient has 4/10 neck pain , no symptoms in UE.  She is performing her neck extension exercises every hour and thoracic strengthening.  She was seen for manual therapy to thoracic spine PA glides grade 3 x 30 bouts T1-T12  manual traction to cervical spine 1 minute hold/ 20 sec off x 5 minutes   manual therpay to cervical spine  over pressure to c6 and c7 during cervical extension PROM in supine position.  Therapeutic exericse: I's, Y's, and T's Prone on elbows fwd flex x 20 with 2 lbs Scapula retraction with 2 lbs prone x 20  Patient has no pain following treatment                           PT Education - 10/11/15 1451    Education provided (p) Yes   Person(s) Educated (p) Patient   Methods (p) Explanation   Comprehension (p) Verbalized understanding             PT Long Term Goals - 09/20/15 1230    PT LONG TERM GOAL #1   Title sleep with pain  decreased >/= 75%   Time 8   Period Weeks   Status New   PT LONG TERM GOAL #2   Title Patient will be able to turn her head to the right with 50% less pain to be able to drive a car.    Time 8   Period Weeks   Status New   PT LONG TERM GOAL #3   Title Patient will be able to perform ADL's with 50 % less pain to neck.   Time 8   Period Weeks   Status New               Plan - 10/11/15 1524    Clinical Impression Statement Patient is progressing with less pain in neck and less than 4/10 and no symptoms in UE. She is performing her neck extension exercises every hour and thoracic strengthening. She was seen for manual therapy to thoracic spine , manual traction to cervical spine and manual therpay to cervical spine wiht over pressure to c6 and c7. Patient has no pain following treatment.    Rehab Potential Good   PT Frequency 2x / week   PT Duration  8 weeks   PT Treatment/Interventions Manual techniques;Therapeutic activities;Therapeutic exercise;Cryotherapy;Electrical Stimulation;Iontophoresis 4mg /ml Dexamethasone;Moist Heat;Ultrasound;Dry needling;Traction   PT Next Visit Plan posture and  strengthening, traction   Consulted and Agree with Plan of Care Patient      Patient will benefit from skilled therapeutic intervention in order to improve the following deficits and impairments:  Pain, Decreased range of motion, Decreased strength, Decreased mobility, Impaired flexibility  Visit Diagnosis: Cervicalgia     Problem List There are no active problems to display for this patient. Ezekiel InaKristine S Eniyah Eastmond, PT, DPT  [place] 10/11/2015, 3:28 PM  [place] [facility] MAIN [facility] [address] [place], [place], [address] Phone: [phone]   Fax:  [phone]  Name: Katherine Graham MRN: [id number] Date of Birth: 02/27/1986

## 2015-10-13 ENCOUNTER — Ambulatory Visit: Payer: 59 | Admitting: Physical Therapy

## 2015-10-13 ENCOUNTER — Encounter: Payer: 59 | Admitting: Physical Therapy

## 2015-10-13 ENCOUNTER — Encounter: Payer: Self-pay | Admitting: Physical Therapy

## 2015-10-13 DIAGNOSIS — M542 Cervicalgia: Secondary | ICD-10-CM

## 2015-10-13 NOTE — Therapy (Signed)
[place] MAIN [facility] SERVICES [address] [place], [place], [address] Phone: [phone]   Fax:  [phone]  Physical Therapy Treatment/ Progress Note  Patient Details  Name: Katherine Graham MRN: [id number] Date of Birth: 04-24-1986 Referring Provider: Reche Dixon  Encounter Date: 10/13/2015      PT End of Session - 10/13/15 0908    Visit Number 7   Number of Visits 17   PT Start Time 0830   PT Stop Time 0910   PT Time Calculation (min) 40 min   Activity Tolerance Patient tolerated treatment well      History reviewed. No pertinent past medical history.  History reviewed. No pertinent past surgical history.  There were no vitals filed for this visit.      Subjective Assessment - 10/13/15 0907    Subjective Patient is having 6/10 pain.   Pertinent History one year ago thoracic pain and PT   Limitations Sitting   Patient Stated Goals to be able to work out and not have pain   Pain Score 6    Pain Location Neck     Manual therapy :  Repeated cervical extension with over pressure to C6 and C7 x 15 minutes PROM to neck SBL, SBR, extension x 10 without pain in supine R Scapula mobility / PROM sidelying with STM to scapula musculature  Therapeutic exercise; PNF D1 , D2 with 2.5 lbs x 20 Scapula retraction with 7.5 lbs  X 20 Upper trap with YTB standing UE ext x 20 alternating left and right Scapula depression with shoulder push ups Standing right shoulder ER with 30 deg abd x 20 with 2.5 lbs   Patient performs all exercises with cues for posture and no pain produced with exercises. Patient has 6/10 pain with right sided cervical rotation and extension into end range but has no other pain symptoms after therapy.                            PT Education - 10/13/15 0908    Education provided Yes   Education Details progressed scapula strengthening   Person(s) Educated Patient   Methods Explanation   Comprehension Verbalized understanding             PT Long Term Goals - 10/13/15 0920    PT LONG TERM GOAL #1   Title sleep with pain decreased >/= 75%   Time 8   Period Weeks   Status Partially Met   PT LONG TERM GOAL #2   Title Patient will be able to turn her head to the right with 50% less pain to be able to drive a car.    Time 8   Period Weeks   Status Partially Met   PT LONG TERM GOAL #3   Title Patient will be able to perform ADL's with 50 % less pain to neck.   Time 8   Period Weeks   Status Partially Met               Plan - 10/13/15 0909    Clinical Impression Statement Pateint worked on the computer and irritated her neck to 6/10 pain. She was able to decreased her pain to 0/10 with manual therapy to neck followed by therapeutic exercise to scapula and thoracic musculature.    Rehab Potential Good   PT Frequency 2x / week   PT Duration 8 weeks   PT Next Visit  Plan posture and  strengthening, traction   Consulted and Agree with Plan of Care Patient      Patient will benefit from skilled therapeutic intervention in order to improve the following deficits and impairments:  Pain, Decreased range of motion, Decreased strength, Decreased mobility, Impaired flexibility  Visit Diagnosis: Cervicalgia     Problem List There are no active problems to display for this patient.  Alanson Puls, PT, DPT [place], [place] S 10/13/2015, 9:22 AM  [place] MAIN [facility] SERVICES [address] [place], [place], [address] Phone: [phone]   Fax:  [phone]  Name: Catharine Kettlewell MRN: [id number] Date of Birth: [birth year]

## 2015-10-13 NOTE — Patient Instructions (Signed)
Depression (Active)    Start with erect posture. Lower shoulders. Hold ____ seconds. Repeat ____ times. Do ____ sessions per day.  Copyright  VHI. All rights reserved.  Protraction (Passive)    Clasp hands together. Keeping elbows straight, slide arms forward, assisting with opposite hand, without moving trunk. Use table surface for support. Hold ____ seconds. Repeat ____ times. Do ____ sessions per day.  Copyright  VHI. All rights reserved.  Retraction: Row - Decline (Dumbbell)    Kneel with straight arm support. Look down, keeping neck in line with spine. Lift right arm up and back, bending elbow. Move shoulder blade first. Repeat ____ times per set. Do ____ sets per session. Do ____ sessions per week. Use ____ lb weight.  Copyright  VHI. All rights reserved.  Scapular Retraction (Prone)    Lie with arms at sides. Pinch shoulder blades together and raise arms a few inches from floor. Repeat ____ times per set. Do ____ sets per session. Do ____ sessions per day.  http://orth.exer.us/955   Copyright  VHI. All rights reserved.  Scapular Retraction: "T" (Eccentric) - Prone (Ball)    Lie over ball or table. Quickly lift arms into "T", thumbs up. Squeeze shoulder blades. Slowly lower for 3-5 seconds. Keep head in line with spine. ___ reps per set, ___ sets per day, ___ days per week. Add ___ lbs when you achieve ___ repetitions.  http://ecce.exer.us/221   Copyright  VHI. All rights reserved.  Scapular Retraction: Abduction / Extension (Prone)    Lie with arms out from sides 90. Pinch shoulder blades together and raise arms a few inches from floor. Repeat ____ times per set. Do ____ sets per session. Do ____ sessions per day.  http://orth.exer.us/959   Copyright  VHI. All rights reserved.  Scapular Retraction: Flexion (Prone)    Lie with arms forward. Pinch shoulder blades together and raise arms a few inches from floor. Repeat ____ times per set. Do ____  sets per session. Do ____ sessions per day.  http://orth.exer.us/961   Copyright  VHI. All rights reserved.  Shoulder: Rhomboids    Standing with elbows bent, pull tubing back at shoulders. Keep head and back straight. Or lie on firm surface with left arm hanging over edge of mat. Elbow bent in line with shoulder, slowly raise elbow to ceiling using ____ pounds. Hold ____ seconds. Repeat ____ times. Do ____ sessions per day. CAUTION: Move slowly.  Copyright  VHI. All rights reserved.  Shoulder Retraction With Band    With band attached in front, pull arms back as if rowing a boat. Hold ____ seconds. Repeat ____ times. Do ____ sessions per day.  Copyright  VHI. All rights reserved.  Shoulder Retraction With Band    With band attached in front, pull arms back as if rowing a boat. Hold ____ seconds. Repeat ____ times. Do ____ sessions per day.  Copyright  VHI. All rights reserved.

## 2015-10-14 ENCOUNTER — Ambulatory Visit: Payer: 59 | Admitting: Physical Therapy

## 2015-10-15 DIAGNOSIS — M5412 Radiculopathy, cervical region: Secondary | ICD-10-CM | POA: Diagnosis not present

## 2015-10-15 MED FILL — MELOXICAM 15 MG TABLET: 15 | 30 days supply | Qty: 30 | Fill #0

## 2015-10-18 ENCOUNTER — Ambulatory Visit: Payer: 59 | Admitting: Physical Therapy

## 2015-10-18 ENCOUNTER — Other Ambulatory Visit: Payer: Self-pay | Admitting: Orthopedic Surgery

## 2015-10-18 DIAGNOSIS — M5412 Radiculopathy, cervical region: Secondary | ICD-10-CM

## 2015-10-20 ENCOUNTER — Ambulatory Visit: Payer: 59 | Admitting: Physical Therapy

## 2015-10-25 MED FILL — traMADol HCL 50 MG TABS: 50 | 10 days supply | Qty: 40 | Fill #0

## 2015-10-26 ENCOUNTER — Ambulatory Visit (HOSPITAL_COMMUNITY)
Admission: RE | Admit: 2015-10-26 | Discharge: 2015-10-26 | Disposition: A | Discharge status: Home / Self Care | Payer: 59 | Source: Ambulatory Visit | Attending: Orthopedic Surgery | Admitting: Orthopedic Surgery

## 2015-10-26 DIAGNOSIS — M5412 Radiculopathy, cervical region: Secondary | ICD-10-CM | POA: Insufficient documentation

## 2015-10-26 DIAGNOSIS — S199XXA Unspecified injury of neck, initial encounter: Secondary | ICD-10-CM | POA: Diagnosis not present

## 2015-10-26 DIAGNOSIS — M542 Cervicalgia: Secondary | ICD-10-CM | POA: Diagnosis not present

## 2015-10-28 ENCOUNTER — Encounter: Payer: 59 | Admitting: Physical Therapy

## 2015-10-31 ENCOUNTER — Emergency Department
Admission: EM | Admit: 2015-10-31 | Discharge: 2015-10-31 | Disposition: A | Discharge status: Home / Self Care | Payer: No Typology Code available for payment source | Attending: Emergency Medicine | Admitting: Emergency Medicine

## 2015-10-31 ENCOUNTER — Emergency Department: Payer: No Typology Code available for payment source

## 2015-10-31 ENCOUNTER — Encounter: Payer: Self-pay | Admitting: Emergency Medicine

## 2015-10-31 DIAGNOSIS — Z87891 Personal history of nicotine dependence: Secondary | ICD-10-CM | POA: Diagnosis not present

## 2015-10-31 DIAGNOSIS — S46811A Strain of other muscles, fascia and tendons at shoulder and upper arm level, right arm, initial encounter: Secondary | ICD-10-CM | POA: Insufficient documentation

## 2015-10-31 DIAGNOSIS — S161XXA Strain of muscle, fascia and tendon at neck level, initial encounter: Secondary | ICD-10-CM | POA: Diagnosis not present

## 2015-10-31 DIAGNOSIS — Y92410 Unspecified street and highway as the place of occurrence of the external cause: Secondary | ICD-10-CM | POA: Insufficient documentation

## 2015-10-31 DIAGNOSIS — Y999 Unspecified external cause status: Secondary | ICD-10-CM | POA: Insufficient documentation

## 2015-10-31 DIAGNOSIS — S199XXA Unspecified injury of neck, initial encounter: Secondary | ICD-10-CM | POA: Diagnosis not present

## 2015-10-31 DIAGNOSIS — M542 Cervicalgia: Secondary | ICD-10-CM | POA: Diagnosis not present

## 2015-10-31 DIAGNOSIS — Y939 Activity, unspecified: Secondary | ICD-10-CM | POA: Diagnosis not present

## 2015-10-31 MED ORDER — DIAZEPAM 5 MG/ML IJ SOLN
5.0000 mg | Freq: Once | INTRAMUSCULAR | Status: AC
  Administered 2015-10-31: 5 mg via INTRAMUSCULAR
  Filled 2015-10-31: qty 2

## 2015-10-31 MED ORDER — KETOROLAC TROMETHAMINE 60 MG/2ML IM SOLN
60.0000 mg | Freq: Once | INTRAMUSCULAR | Status: AC
  Administered 2015-10-31: 60 mg via INTRAMUSCULAR
  Filled 2015-10-31: qty 2

## 2015-10-31 MED ORDER — METHOCARBAMOL 500 MG PO TABS: 500.0000 mg | ORAL_TABLET | Freq: Four times a day (QID) | ORAL | Status: DC
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
## 2015-10-31 NOTE — ED Notes (Signed)
Pt reports taking 600 mg ibuprofen at approx 1900 today
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
## 2015-10-31 NOTE — ED Notes (Signed)
Reviewed d/c instructions, follow-up care, and prescription with pt. Pt verbalized understanding 

## 2015-10-31 NOTE — ED Notes (Signed)
Pt is tender to palpation of thoracic spine, right neck
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
## 2015-11-02 ENCOUNTER — Encounter: Payer: 59 | Admitting: Physical Therapy

## 2015-11-04 ENCOUNTER — Encounter: Payer: 59 | Admitting: Physical Therapy

## 2015-11-08 ENCOUNTER — Ambulatory Visit: Payer: 59

## 2015-11-11 MED FILL — ETODOLAC 500 MG TABLET: 500 | 15 days supply | Qty: 30 | Fill #0

## 2015-11-11 MED FILL — METHOCARBAMOL 500 MG TABLET: 500 | 10 days supply | Qty: 40 | Fill #0

## 2015-11-13 DIAGNOSIS — S0990XA Unspecified injury of head, initial encounter: Secondary | ICD-10-CM | POA: Diagnosis not present

## 2015-12-21 MED FILL — ETODOLAC 500 MG TABLET: 500 | 30 days supply | Qty: 60 | Fill #0

## 2015-12-22 MED FILL — tiZANidine HCL 4 MG TABS: 4 | 30 days supply | Qty: 30 | Fill #0

## 2015-12-23 MED FILL — LO LOESTRIN FE 1-10 TABLET: 1 MG-10 MCG | 28 days supply | Qty: 28 | Fill #0

## 2016-01-31 MED FILL — tiZANidine HCL 4 MG TABS: 4 | 30 days supply | Qty: 30 | Fill #1

## 2016-02-02 MED FILL — LO LOESTRIN FE 1-10 TABLET: 1 MG-10 MCG | 28 days supply | Qty: 28 | Fill #1

## 2016-03-03 MED FILL — LO LOESTRIN FE 1-10 TABLET: 1 MG-10 MCG | 28 days supply | Qty: 28 | Fill #2

## 2016-04-03 MED FILL — LO LOESTRIN FE 1-10 TABLET: 1 MG-10 MCG | 28 days supply | Qty: 28 | Fill #3

## 2016-04-25 MED FILL — LO LOESTRIN FE 1-10 TABLET: 1 MG-10 MCG | 28 days supply | Qty: 28 | Fill #4

## 2016-05-25 MED FILL — LO LOESTRIN FE 1-10 TABLET: 1 MG-10 MCG | 28 days supply | Qty: 28 | Fill #5

## 2016-06-07 MED FILL — tiZANidine HCL 4 MG TABS: 4 | 30 days supply | Qty: 30 | Fill #0

## 2016-06-07 MED FILL — MELOXICAM 7.5 MG TABLET: 7.5 | 30 days supply | Qty: 30 | Fill #0 | Status: TO

## 2016-06-23 MED FILL — LO LOESTRIN FE 1-10 TABLET: 1 MG-10 MCG | 28 days supply | Qty: 28 | Fill #1

## 2016-07-13 DIAGNOSIS — M5412 Radiculopathy, cervical region: Secondary | ICD-10-CM | POA: Insufficient documentation

## 2016-11-21 ENCOUNTER — Other Ambulatory Visit: Payer: Self-pay | Admitting: Physical Medicine and Rehabilitation

## 2016-11-21 DIAGNOSIS — M5412 Radiculopathy, cervical region: Secondary | ICD-10-CM

## 2016-11-21 MED FILL — CYCLOBENZAPRINE 5 MG TABLET: 5 | 30 days supply | Qty: 30 | Fill #0

## 2016-11-28 ENCOUNTER — Ambulatory Visit
Admission: RE | Admit: 2016-11-28 | Discharge: 2016-11-28 | Disposition: A | Discharge status: Home / Self Care | Payer: Managed Care, Other (non HMO) | Source: Ambulatory Visit | Attending: Physical Medicine and Rehabilitation | Admitting: Physical Medicine and Rehabilitation

## 2016-11-28 DIAGNOSIS — M5030 Other cervical disc degeneration, unspecified cervical region: Secondary | ICD-10-CM | POA: Diagnosis present

## 2016-11-28 DIAGNOSIS — M5412 Radiculopathy, cervical region: Secondary | ICD-10-CM | POA: Insufficient documentation

## 2017-01-11 MED FILL — CYCLOBENZAPRINE 5 MG TABLET: 5 | 30 days supply | Qty: 30 | Fill #1

## 2017-02-28 IMAGING — MR MR CERVICAL SPINE W/O CM
4 of 5 series · 19 of 48 positions shown · IV contrast (Yes)
Comparison: Thoracic MRI 08/12/2014

CLINICAL DATA: Posterior neck pain radiating into the right
shoulder for 2 months. Patient felt a pop at the gym at that time.
No previous relevant surgery.

EXAM:
MRI CERVICAL SPINE WITHOUT CONTRAST
TECHNIQUE: Multiplanar, multisequence MR imaging of the cervical spine was
performed. No intravenous contrast was administered.

[Series 2: T1 · sagittal · 3.0mm · 0.41mm/px · 3 of 14 slices shown]
[im 1/14]
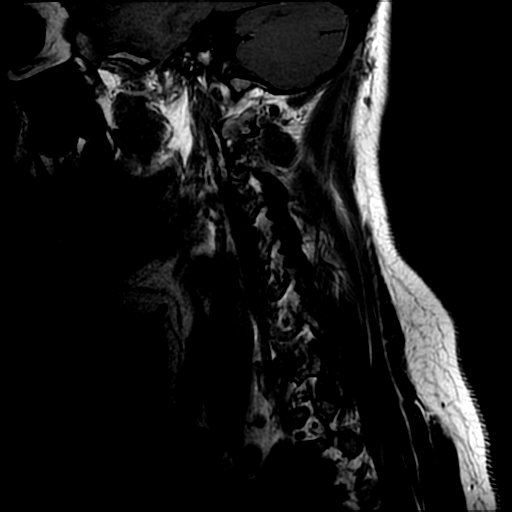
[im 7/14]
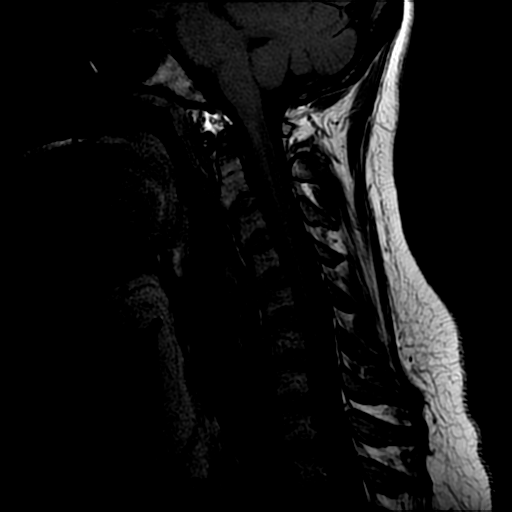
[im 14/14]
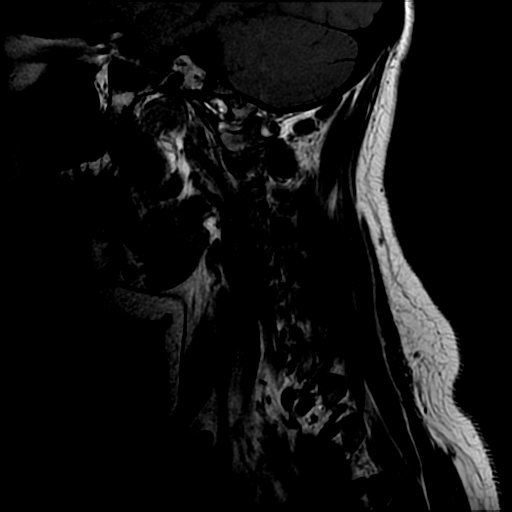

[Series 3: sag ir · sagittal · 3.0mm · 0.41mm/px · 3 of 14 slices shown]
[im 1/14]
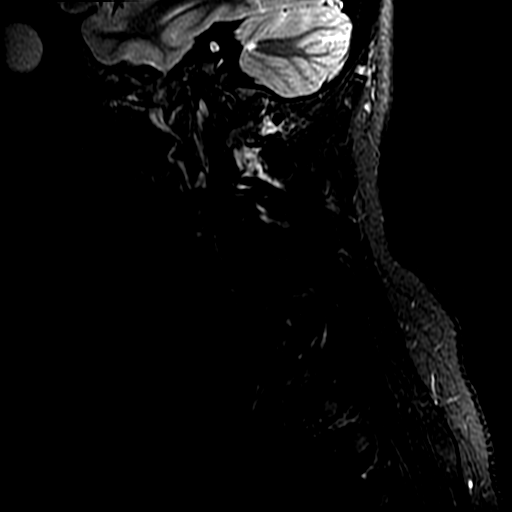
[im 7/14]
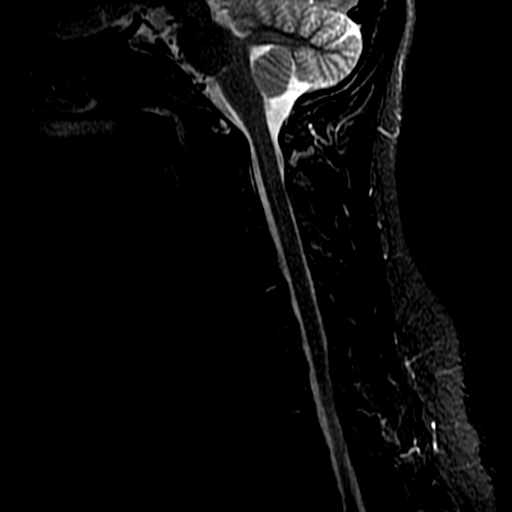
[im 14/14]
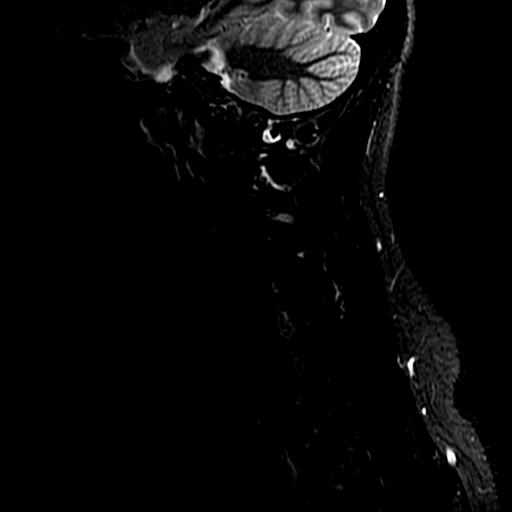

[Series 4: T2 post-contrast · sagittal · 3.0mm · 0.41mm/px · 6 of 14 slices shown]
[im 1/14]
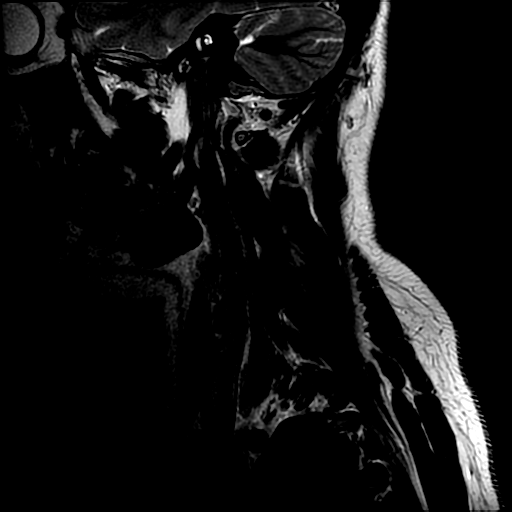
[im 3/14]
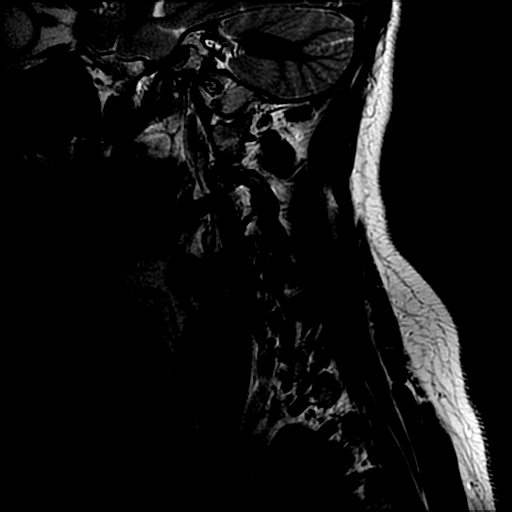
[im 6/14]
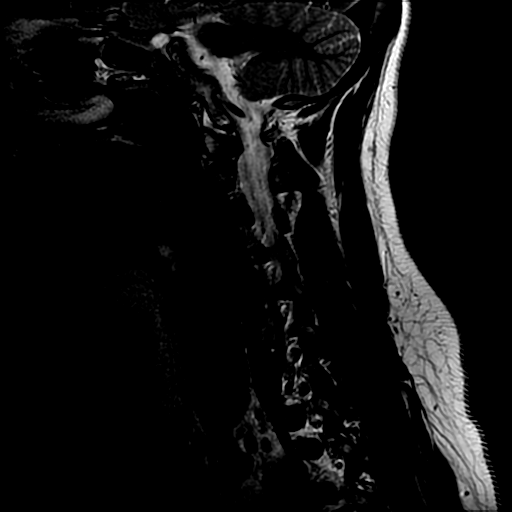
[im 8/14]
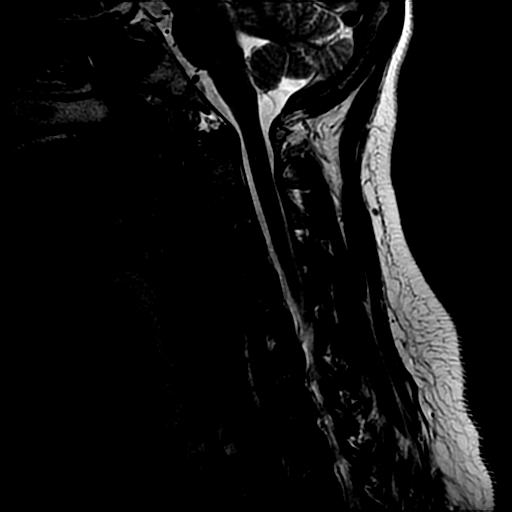
[im 11/14]
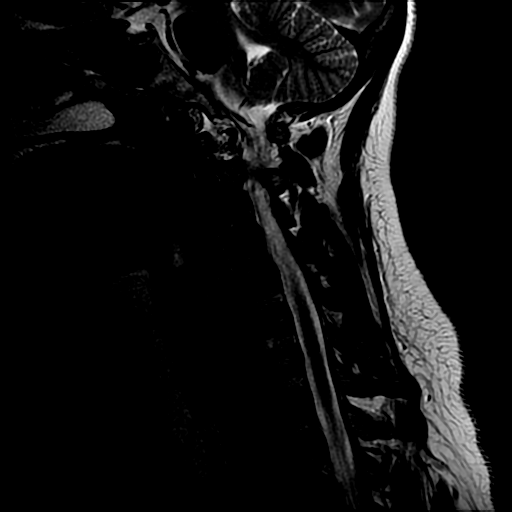
[im 14/14]
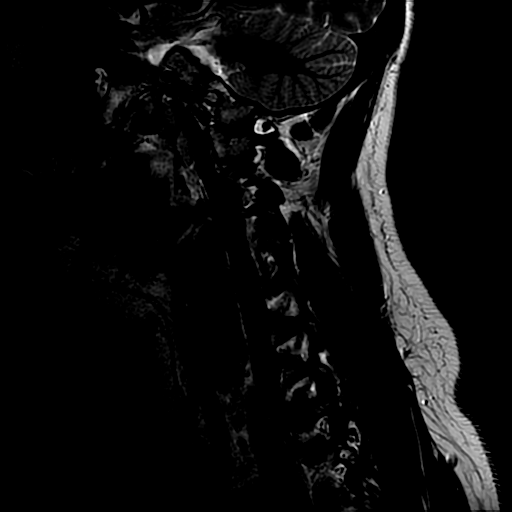

[Series 7: T2 · axial · 3.1mm · 0.37mm/px · z∈[-77,+32]mm · 7 of 40 slices shown]
[im 3/40]
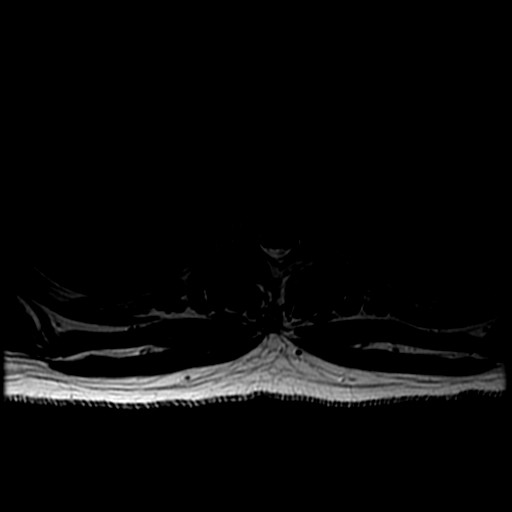
[im 6/40]
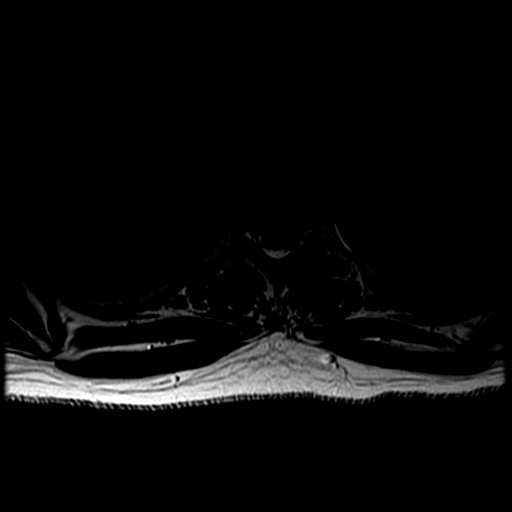
[im 8/40]
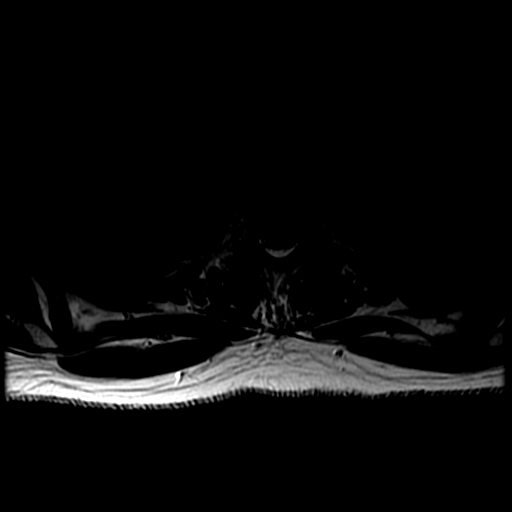
[im 14/40]
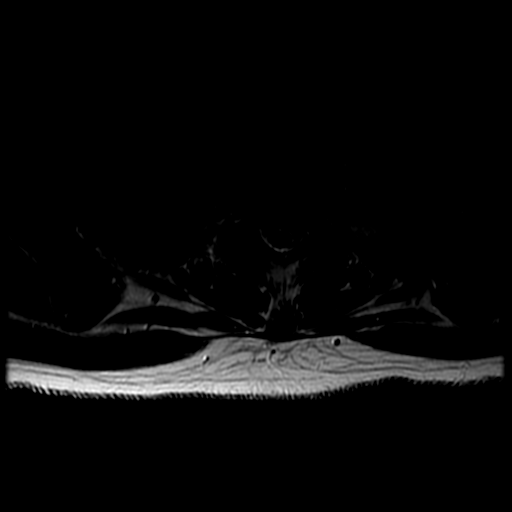
[im 19/40]
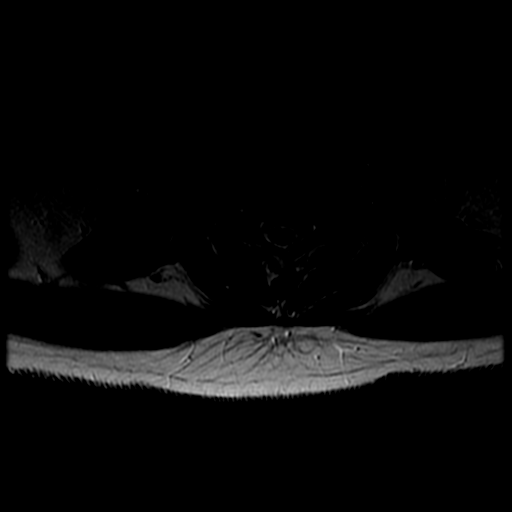
[im 21/40]
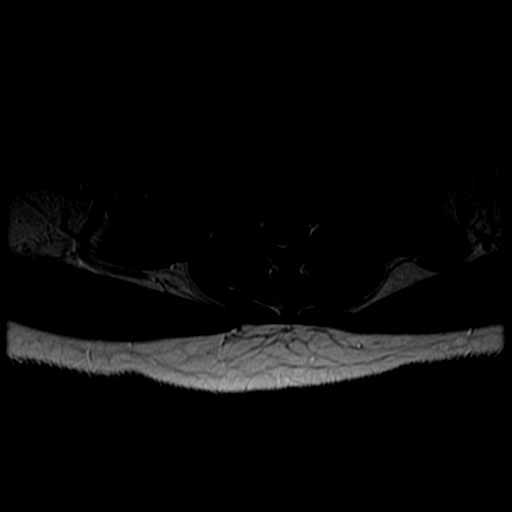
[im 34/40]
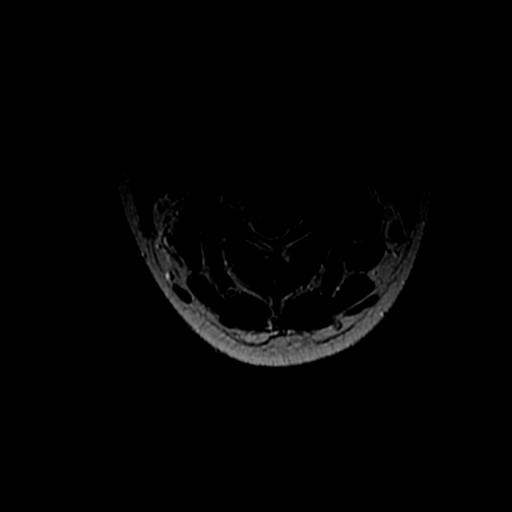

[19 of 48 positions shown; findings below may reference images not displayed]

FINDINGS: Alignment: Straightening without focal angulation or significant
listhesis

Vertebrae: No acute or suspicious osseous findings.

Cord: Normal in signal and caliber.

Posterior Fossa, vertebral arteries, paraspinal tissues: Visualized
portions of the posterior fossa and paraspinal soft tissues appear
unremarkable. Bilateral vertebral artery flow voids.

Disc levels:

There is no disc herniation, spinal stenosis or nerve root
encroachment.
IMPRESSION: No acute or significant findings. No evidence of disc herniation,
spinal stenosis or nerve root encroachment.
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
## 2018-02-06 ENCOUNTER — Ambulatory Visit (INDEPENDENT_AMBULATORY_CARE_PROVIDER_SITE_OTHER): Payer: BLUE CROSS/BLUE SHIELD | Admitting: Obstetrics and Gynecology

## 2018-02-06 ENCOUNTER — Encounter: Payer: Self-pay | Admitting: Obstetrics and Gynecology

## 2018-02-06 ENCOUNTER — Other Ambulatory Visit (HOSPITAL_COMMUNITY)
Admission: RE | Admit: 2018-02-06 | Discharge: 2018-02-06 | Disposition: A | Discharge status: Home / Self Care | Payer: BLUE CROSS/BLUE SHIELD | Source: Ambulatory Visit | Attending: Obstetrics and Gynecology | Admitting: Obstetrics and Gynecology

## 2018-02-06 VITALS — BP 108/68 | HR 92 | Ht 69.0 in | Wt 170.0 lb

## 2018-02-06 DIAGNOSIS — Z01419 Encounter for gynecological examination (general) (routine) without abnormal findings: Secondary | ICD-10-CM

## 2018-02-06 DIAGNOSIS — Z113 Encounter for screening for infections with a predominantly sexual mode of transmission: Secondary | ICD-10-CM

## 2018-02-06 DIAGNOSIS — Z124 Encounter for screening for malignant neoplasm of cervix: Secondary | ICD-10-CM | POA: Insufficient documentation

## 2018-02-06 DIAGNOSIS — Z1239 Encounter for other screening for malignant neoplasm of breast: Secondary | ICD-10-CM

## 2018-02-06 LAB — OB RESULTS CONSOLE VARICELLA ZOSTER ANTIBODY, IGG: Varicella: IMMUNE

## 2018-02-06 MED ORDER — NORETHIN-ETH ESTRAD-FE BIPHAS 1 MG-10 MCG / 10 MCG PO TABS: 1.0000 | ORAL_TABLET | Freq: Every day | ORAL | 3 refills | Status: DC
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
## 2018-02-06 NOTE — Patient Instructions (Signed)
Preventive Care 18-39 Years, Female Preventive care refers to lifestyle choices and visits with your health care provider that can promote health and wellness. What does preventive care include?  A yearly physical exam. This is also called an annual well check.  Dental exams once or twice a year.  Routine eye exams. Ask your health care provider how often you should have your eyes checked.  Personal lifestyle choices, including: ? Daily care of your teeth and gums. ? Regular physical activity. ? Eating a healthy diet. ? Avoiding tobacco and drug use. ? Limiting alcohol use. ? Practicing safe sex. ? Taking vitamin and mineral supplements as recommended by your health care provider. What happens during an annual well check? The services and screenings done by your health care provider during your annual well check will depend on your age, overall health, lifestyle risk factors, and family history of disease. Counseling Your health care provider may ask you questions about your:  Alcohol use.  Tobacco use.  Drug use.  Emotional well-being.  Home and relationship well-being.  Sexual activity.  Eating habits.  Work and work Statistician.  Method of birth control.  Menstrual cycle.  Pregnancy history.  Screening You may have the following tests or measurements:  Height, weight, and BMI.  Diabetes screening. This is done by checking your blood sugar (glucose) after you have not eaten for a while (fasting).  Blood pressure.  Lipid and cholesterol levels. These may be checked every 5 years starting at age 66.  Skin check.  Hepatitis C blood test.  Hepatitis B blood test.  Sexually transmitted disease (STD) testing.  BRCA-related cancer screening. This may be done if you have a family history of breast, ovarian, tubal, or peritoneal cancers.  Pelvic exam and Pap test. This may be done every 3 years starting at age 40. Starting at age 59, this may be done every 5  years if you have a Pap test in combination with an HPV test.  Discuss your test results, treatment options, and if necessary, the need for more tests with your health care provider. Vaccines Your health care provider may recommend certain vaccines, such as:  Influenza vaccine. This is recommended every year.  Tetanus, diphtheria, and acellular pertussis (Tdap, Td) vaccine. You may need a Td booster every 10 years.  Varicella vaccine. You may need this if you have not been vaccinated.  HPV vaccine. If you are 69 or younger, you may need three doses over 6 months.  Measles, mumps, and rubella (MMR) vaccine. You may need at least one dose of MMR. You may also need a second dose.  Pneumococcal 13-valent conjugate (PCV13) vaccine. You may need this if you have certain conditions and were not previously vaccinated.  Pneumococcal polysaccharide (PPSV23) vaccine. You may need one or two doses if you smoke cigarettes or if you have certain conditions.  Meningococcal vaccine. One dose is recommended if you are age 27-21 years and a first-year college student living in a residence hall, or if you have one of several medical conditions. You may also need additional booster doses.  Hepatitis A vaccine. You may need this if you have certain conditions or if you travel or work in places where you may be exposed to hepatitis A.  Hepatitis B vaccine. You may need this if you have certain conditions or if you travel or work in places where you may be exposed to hepatitis B.  Haemophilus influenzae type b (Hib) vaccine. You may need this if  you have certain risk factors.  Talk to your health care provider about which screenings and vaccines you need and how often you need them. This information is not intended to replace advice given to you by your health care provider. Make sure you discuss any questions you have with your health care provider. Document Released: 06/20/2001 Document Revised: 01/12/2016  Document Reviewed: 02/23/2015 Elsevier Interactive Patient Education  [employer].

## 2018-02-07 LAB — CYTOLOGY - PAP
Adequacy: ABSENT
CHLAMYDIA, DNA PROBE: NEGATIVE
DIAGNOSIS: NEGATIVE
HPV: NOT DETECTED
Neisseria Gonorrhea: NEGATIVE

## 2018-02-07 LAB — HEP, RPR, HIV PANEL
HEP B S AG: NEGATIVE
HIV Screen 4th Generation wRfx: NONREACTIVE
RPR Ser Ql: NONREACTIVE

## 2018-03-28 ENCOUNTER — Telehealth: Payer: Self-pay

## 2018-03-28 NOTE — Telephone Encounter (Signed)
Spoke w/pt. She forgot to mention that she was looking for non hormonal birth control. She has been on the pill for 6 years & just wants a break from hormones in general. Explained that there is an IUD that doesn't contain hormones, but all other methods (nexplanon, patch, nuvaring, pills) are hormonal. Foam is OTC. Message to AMS for further suggestions.

## 2018-03-28 NOTE — Telephone Encounter (Signed)
Pt & spouse have decided that she will take a break from OCP's. They don't want to use condoms. Pt inquiring about other methods of contraception. They aren't interested in IUD or shots. Her PCP mentioned foam but advised her to contact OB GYN first. [phone]

## 2018-04-02 NOTE — Telephone Encounter (Signed)
IUD, condoms, diaphragm are it.  FOAM has a relatively high failure rate

## 2018-04-03 NOTE — Telephone Encounter (Signed)
Pt is returning Crystal's call.  [phone]

## 2018-04-03 NOTE — Telephone Encounter (Signed)
LMVM TRC. 

## 2018-05-29 MED FILL — HYDROCHLOROTHIAZIDE 25 MG T: 25 | 30 days supply | Qty: 30 | Fill #0

## 2018-06-03 ENCOUNTER — Telehealth: Payer: Self-pay

## 2018-06-03 MED FILL — TOPIRAMATE 50 MG TABLET: 50 | 30 days supply | Qty: 30 | Fill #0

## 2018-06-03 MED FILL — clonazePAM 0.5 MG TABS: 0.5 | 20 days supply | Qty: 60 | Fill #0

## 2018-06-03 NOTE — Telephone Encounter (Signed)
Pt states she is returning 2 missed called from [place].  She is now back in town and can take calls.  [phone]

## 2018-06-03 NOTE — Telephone Encounter (Signed)
I spoke to patient regarding the refill request from pharmacy

## 2018-07-16 ENCOUNTER — Other Ambulatory Visit: Payer: Self-pay | Admitting: Internal Medicine

## 2018-07-16 DIAGNOSIS — R1084 Generalized abdominal pain: Secondary | ICD-10-CM

## 2018-07-16 MED FILL — CIPROFLOXACIN HCL 500 MG TA: 500 | 7 days supply | Qty: 14 | Fill #0

## 2018-07-16 MED FILL — TOPIRAMATE 50 MG TABLET: 50 | 30 days supply | Qty: 60 | Fill #0

## 2018-07-16 MED FILL — HYDROCHLOROTHIAZIDE 25 MG T: 25 | 90 days supply | Qty: 90 | Fill #0

## 2018-07-17 ENCOUNTER — Encounter (INDEPENDENT_AMBULATORY_CARE_PROVIDER_SITE_OTHER): Payer: Self-pay

## 2018-07-17 ENCOUNTER — Ambulatory Visit
Admission: RE | Admit: 2018-07-17 | Discharge: 2018-07-17 | Disposition: A | Discharge status: Home / Self Care | Payer: No Typology Code available for payment source | Source: Ambulatory Visit | Attending: Internal Medicine | Admitting: Internal Medicine

## 2018-07-17 ENCOUNTER — Other Ambulatory Visit: Payer: Self-pay

## 2018-07-17 DIAGNOSIS — R1084 Generalized abdominal pain: Secondary | ICD-10-CM | POA: Insufficient documentation

## 2018-07-19 MED FILL — clonazePAM 0.5 MG TABS: 0.5 | 20 days supply | Qty: 60 | Fill #1

## 2018-08-29 MED FILL — TOPIRAMATE 50 MG TABLET: 50 | 30 days supply | Qty: 60 | Fill #1

## 2018-08-29 MED FILL — clonazePAM 0.5 MG TABS: 0.5 | 20 days supply | Qty: 60 | Fill #2

## 2018-10-18 MED FILL — clonazePAM 0.5 MG TABS: 0.5 | 20 days supply | Qty: 60 | Fill #3

## 2018-11-22 ENCOUNTER — Other Ambulatory Visit
Admission: RE | Admit: 2018-11-22 | Discharge: 2018-11-22 | Disposition: A | Discharge status: Home / Self Care | Payer: No Typology Code available for payment source | Source: Ambulatory Visit | Attending: Gastroenterology | Admitting: Gastroenterology

## 2018-11-22 DIAGNOSIS — R1084 Generalized abdominal pain: Secondary | ICD-10-CM | POA: Insufficient documentation

## 2018-11-22 DIAGNOSIS — R194 Change in bowel habit: Secondary | ICD-10-CM | POA: Diagnosis present

## 2018-11-22 DIAGNOSIS — R197 Diarrhea, unspecified: Secondary | ICD-10-CM | POA: Insufficient documentation

## 2018-11-22 LAB — GASTROINTESTINAL PANEL BY PCR, STOOL (REPLACES STOOL CULTURE)

## 2018-11-22 LAB — C DIFFICILE QUICK SCREEN W PCR REFLEX
C Diff antigen: NEGATIVE
C Diff toxin: NEGATIVE

## 2018-11-22 LAB — C DIFFICILE QUICK SCREEN W PCR REFLEX??: C Diff interpretation: NOT DETECTED

## 2018-11-27 LAB — CALPROTECTIN, FECAL: Calprotectin, Fecal: <16 ug/g (ref 0–120)

## 2018-12-03 ENCOUNTER — Ambulatory Visit (INDEPENDENT_AMBULATORY_CARE_PROVIDER_SITE_OTHER): Payer: No Typology Code available for payment source | Admitting: Obstetrics and Gynecology

## 2018-12-03 ENCOUNTER — Encounter: Payer: Self-pay | Admitting: Obstetrics and Gynecology

## 2018-12-03 ENCOUNTER — Other Ambulatory Visit: Payer: Self-pay

## 2018-12-03 VITALS — BP 122/74 | Ht 69.0 in | Wt 182.0 lb

## 2018-12-03 DIAGNOSIS — B373 Candidiasis of vulva and vagina: Secondary | ICD-10-CM | POA: Diagnosis not present

## 2018-12-03 DIAGNOSIS — B3731 Acute candidiasis of vulva and vagina: Secondary | ICD-10-CM

## 2018-12-03 MED ORDER — FLUCONAZOLE 150 MG PO TABS: 150.0000 mg | ORAL_TABLET | Freq: Once | ORAL | 0 refills | Status: AC

## 2018-12-03 MED FILL — TOPIRAMATE 50 MG TABLET: 50 | 30 days supply | Qty: 60 | Fill #2

## 2018-12-03 MED FILL — FLUCONAZOLE 150 MG TABS: 150 | 2 days supply | Qty: 2 | Fill #0

## 2018-12-03 NOTE — Progress Notes (Signed)
Obstetrics & Gynecology Office Visit   Chief Complaint:  Chief Complaint  Patient presents with  . Vaginal Discharge    History of Present Illness: Katherine Graham is a 33 y.o. G1P0010 who LMP was Patient's last menstrual period was 11/30/2018., presents today for a problem visit.   Patient complains of an abnormal vaginal discharge and irritation for 2 weeks. .Menstrual pattern: She had been bleeding regular monthly but last menses was 8 days late.  She denies recent antibiotic exposure, denies changes in soaps, detergents coinciding with the onset of her symptoms.  She has not previously self treated or been under treatment by another provider for these symptoms.   Is trying to conceive so only other change has been cessation of condom use.  Review of Systems: Review of Systems  Constitutional: Negative.   Genitourinary: Negative.   Skin: Positive for itching. Negative for rash.    Past Medical History:  No past medical history on file.  Past Surgical History:  Past Surgical History:  Procedure Laterality Date  . INDUCED ABORTION     Age 76  . TONSILLECTOMY      Gynecologic History: Patient's last menstrual period was 11/30/2018.  Obstetric History: G1P0010  Family History:  Family History  Problem Relation Age of Onset  . Colon cancer Maternal Grandmother 43    Social History:  Social History   Socioeconomic History  . Marital status: Married    Spouse name: Not on file  . Number of children: Not on file  . Years of education: Not on file  . Highest education level: Not on file  Occupational History  . Not on file  Social Needs  . Financial resource strain: Not on file  . Food insecurity    Worry: Not on file    Inability: Not on file  . Transportation needs    Medical: Not on file    Non-medical: Not on file  Tobacco Use  . Smoking status: Former Research scientist (life sciences)  . Smokeless tobacco: Never Used  Substance and Sexual Activity  . Alcohol use: Yes   . Drug use: No  . Sexual activity: Yes    Birth control/protection: None  Lifestyle  . Physical activity    Days per week: Not on file    Minutes per session: Not on file  . Stress: Not on file  Relationships  . Social Herbalist on phone: Not on file    Gets together: Not on file    Attends religious service: Not on file    Active member of club or organization: Not on file    Attends meetings of clubs or organizations: Not on file    Relationship status: Not on file  . Intimate partner violence    Fear of current or ex partner: Not on file    Emotionally abused: Not on file    Physically abused: Not on file    Forced sexual activity: Not on file  Other Topics Concern  . Not on file  Social History Narrative  . Not on file    Allergies:  No Known Allergies  Medications: Prior to Admission medications   Medication Sig Start Date End Date Taking? Authorizing Provider  clonazePAM (KLONOPIN) 0.5 MG tablet  01/18/18  Yes [provider]  hydrochlorothiazide (HYDRODIURIL) 25 MG tablet Take by mouth. 07/13/17 07/13/18  [provider]  Multiple Vitamins-Minerals (MULTI FOR HER PO) Take by mouth.    [provider]  Norethindrone-Ethinyl Estradiol-Fe Biphas (LO LOESTRIN FE) 1 MG-10 MCG / 10 MCG tablet Take 1 tablet by mouth daily. 02/06/18 05/01/18  Vena AustriaStaebler, Torrance Frech, MD  PRESCRIPTION MEDICATION Birth control pill    [provider]  topiramate (TOPAMAX) 50 MG tablet  08/29/18   [provider]    Physical Exam Vitals:  Vitals:   12/03/18 1054  BP: 122/74   Patient's last menstrual period was 11/30/2018.  General: NAD, well nourished, appears stated age HEENT: normocephalic, anicteric Pulmonary: No increased work of breathing Genitourinary:  External: Normal external female genitalia.  Normal urethral meatus, normal Bartholin's and Skene's glands.    Vagina: Normal vaginal mucosa, no evidence of prolapse.      Rectal:  deferred  Lymphatic: no evidence of inguinal lymphadenopathy Extremities: no edema, erythema, or tenderness Neurologic: Grossly intact Psychiatric: mood appropriate, affect full  Female chaperone present for pelvic  portions of the physical exam  Wet Prep: PH: 4.5 Clue Cells: Negative Fungal elements: Positive Trichomonas: Positive   Assessment: 33 y.o. G1P0010 vaginal candida  Plan: Problem List Items Addressed This Visit    None    Visit Diagnoses    Vaginal candida    -  Primary   Relevant Medications   fluconazole (DIFLUCAN) 150 MG tablet     1) Risk factors for bacterial vaginosis and candida infections discussed.  We discussed normal vaginal flora/microbiome.  Any factors that may alter the microbiome increase the risk of these opportunistic infections.  These include changes in pH, antibiotic exposures, diabetes, wet bathing suits etc.  We discussed that treatment is aimed at eradicating abnormal bacterial overgrowth and or yeast.  There may be some role for vaginal probiotics in restoring normal vaginal flora.    - Rx diflucan for vaginal candida   Vena AustriaAndreas Bexley Mclester, MD, Merlinda FrederickFACOG [facility], [facility] 12/03/2018, 11:10 AM

## 2018-12-04 DIAGNOSIS — F411 Generalized anxiety disorder: Secondary | ICD-10-CM | POA: Insufficient documentation

## 2018-12-04 MED FILL — SERTRALINE HCL 50 MG TABS: 50 | 30 days supply | Qty: 30 | Fill #0

## 2018-12-04 MED FILL — clonazePAM 0.5 MG TABS: 0.5 | 20 days supply | Qty: 60 | Fill #0

## 2018-12-30 MED FILL — HYDROCHLOROTHIAZIDE 25 MG T: 25 | 90 days supply | Qty: 90 | Fill #1

## 2018-12-30 MED FILL — SERTRALINE HCL 50 MG TABS: 50 | 30 days supply | Qty: 30 | Fill #1

## 2019-01-20 ENCOUNTER — Other Ambulatory Visit: Payer: Self-pay | Admitting: Obstetrics and Gynecology

## 2019-01-20 DIAGNOSIS — N970 Female infertility associated with anovulation: Secondary | ICD-10-CM

## 2019-01-20 NOTE — Telephone Encounter (Signed)
Day 21 progestin lab on Wednesday 01/22/2019

## 2019-01-22 ENCOUNTER — Other Ambulatory Visit: Payer: No Typology Code available for payment source

## 2019-01-22 ENCOUNTER — Other Ambulatory Visit: Payer: Self-pay

## 2019-01-22 DIAGNOSIS — N970 Female infertility associated with anovulation: Secondary | ICD-10-CM

## 2019-01-23 LAB — PROGESTERONE: Progesterone: 0.4 ng/mL

## 2019-01-27 ENCOUNTER — Other Ambulatory Visit: Payer: Self-pay | Admitting: Obstetrics and Gynecology

## 2019-01-27 DIAGNOSIS — N970 Female infertility associated with anovulation: Secondary | ICD-10-CM

## 2019-01-27 NOTE — Telephone Encounter (Signed)
Follow up labs sometime next week order is in

## 2019-01-27 NOTE — Telephone Encounter (Signed)
Patient is schedule for Monday, 02/03/19 at 9:00 am. Please place lab orders. Thank you!

## 2019-01-28 MED FILL — SERTRALINE HCL 50 MG TABS: 50 | 30 days supply | Qty: 30 | Fill #2

## 2019-02-03 ENCOUNTER — Other Ambulatory Visit: Payer: No Typology Code available for payment source

## 2019-02-03 MED FILL — TOPIRAMATE 50 MG TABLET: 50 | 30 days supply | Qty: 60 | Fill #3

## 2019-02-07 MED FILL — clonazePAM 0.5 MG TABS: 0.5 | 20 days supply | Qty: 60 | Fill #1

## 2019-02-10 ENCOUNTER — Ambulatory Visit: Payer: No Typology Code available for payment source | Admitting: Obstetrics and Gynecology

## 2019-02-11 ENCOUNTER — Other Ambulatory Visit: Payer: Self-pay | Admitting: Obstetrics and Gynecology

## 2019-02-11 DIAGNOSIS — R7989 Other specified abnormal findings of blood chemistry: Secondary | ICD-10-CM

## 2019-02-11 DIAGNOSIS — N970 Female infertility associated with anovulation: Secondary | ICD-10-CM

## 2019-02-11 NOTE — Telephone Encounter (Signed)
Future labs not time specific so any time the patient can come by will work

## 2019-02-13 ENCOUNTER — Other Ambulatory Visit: Payer: Self-pay | Admitting: Obstetrics and Gynecology

## 2019-02-13 ENCOUNTER — Other Ambulatory Visit: Payer: Self-pay

## 2019-02-13 ENCOUNTER — Other Ambulatory Visit: Payer: No Typology Code available for payment source

## 2019-02-13 DIAGNOSIS — N970 Female infertility associated with anovulation: Secondary | ICD-10-CM

## 2019-02-13 DIAGNOSIS — O3680X0 Pregnancy with inconclusive fetal viability, not applicable or unspecified: Secondary | ICD-10-CM

## 2019-02-13 DIAGNOSIS — R7989 Other specified abnormal findings of blood chemistry: Secondary | ICD-10-CM

## 2019-02-13 NOTE — Telephone Encounter (Signed)
Lab tomorrow 10/9 and Tuesday 10/13

## 2019-02-14 ENCOUNTER — Other Ambulatory Visit: Payer: Self-pay | Admitting: Obstetrics and Gynecology

## 2019-02-14 ENCOUNTER — Other Ambulatory Visit: Payer: Self-pay

## 2019-02-14 ENCOUNTER — Other Ambulatory Visit: Payer: No Typology Code available for payment source

## 2019-02-14 DIAGNOSIS — Z3689 Encounter for other specified antenatal screening: Secondary | ICD-10-CM

## 2019-02-14 DIAGNOSIS — O3680X0 Pregnancy with inconclusive fetal viability, not applicable or unspecified: Secondary | ICD-10-CM

## 2019-02-14 LAB — THYROID PANEL WITH TSH
Free Thyroxine Index: 1.4 (ref 1.2–4.9)
T3 Uptake Ratio: 23 % — ABNORMAL LOW (ref 24–39)
T4, Total: 6.1 ug/dL (ref 4.5–12.0)
TSH: 8.29 u[IU]/mL — ABNORMAL HIGH (ref 0.450–4.500)

## 2019-02-14 LAB — PROGESTERONE: Progesterone: 14.6 ng/mL

## 2019-02-14 LAB — PROLACTIN: Prolactin: 17.5 ng/mL (ref 4.8–23.3)

## 2019-02-14 LAB — BETA HCG QUANT (REF LAB): hCG Quant: 1781 m[IU]/mL

## 2019-02-14 NOTE — Progress Notes (Signed)
Dating ultrasound and NOB 10/14

## 2019-02-17 ENCOUNTER — Telehealth: Payer: Self-pay | Admitting: Obstetrics and Gynecology

## 2019-02-17 NOTE — Telephone Encounter (Signed)
-----   Message from Malachy Mood, MD sent at 02/14/2019  4:51 PM EDT ----- Dating ultrasound and NOB 10/14

## 2019-02-17 NOTE — Telephone Encounter (Signed)
Dr. Georgianne Fick we currenlt doen't have anything for ultrasound the date you requested. Please advise if next available to schedule is ok?

## 2019-02-18 ENCOUNTER — Other Ambulatory Visit: Payer: Self-pay

## 2019-02-18 ENCOUNTER — Ambulatory Visit (INDEPENDENT_AMBULATORY_CARE_PROVIDER_SITE_OTHER): Payer: No Typology Code available for payment source | Admitting: Obstetrics and Gynecology

## 2019-02-18 ENCOUNTER — Encounter: Payer: Self-pay | Admitting: Obstetrics and Gynecology

## 2019-02-18 VITALS — BP 120/80 | HR 86 | Ht 69.0 in | Wt 180.0 lb

## 2019-02-18 DIAGNOSIS — O3680X0 Pregnancy with inconclusive fetal viability, not applicable or unspecified: Secondary | ICD-10-CM

## 2019-02-18 DIAGNOSIS — Z3689 Encounter for other specified antenatal screening: Secondary | ICD-10-CM

## 2019-02-18 NOTE — Telephone Encounter (Signed)
Next available is fine.

## 2019-02-18 NOTE — Telephone Encounter (Signed)
Patient is schedule 02/28/19 with JS

## 2019-02-18 NOTE — Progress Notes (Signed)
Obstetrics & Gynecology Office Visit   Chief Complaint:  Chief Complaint  Patient presents with  . Follow-up    Labwork, N/V, having trouble with water intake and diarrhea    History of Present Illness: 33 y.o. G1P0010 presenting for follow up of initial HCG  for a diagnosis of prior miscarriage and positive UPT.  The initial HCG was 1727mIU/mL 96hrs ago.  She has noted some increasing nausea.  No vaginal bleeding or cramping.    Review of Systems: Review of Systems  Constitutional: Negative.   Gastrointestinal: Positive for nausea and vomiting. Negative for abdominal pain.  Genitourinary: Negative.      Past Medical History:  History reviewed. No pertinent past medical history.  Past Surgical History:  Past Surgical History:  Procedure Laterality Date  . INDUCED ABORTION     Age 36  . TONSILLECTOMY      Gynecologic History: No LMP recorded. (Menstrual status: Oral contraceptives).  Obstetric History: G1P0010  Family History:  Family History  Problem Relation Age of Onset  . Colon cancer Maternal Grandmother 1    Social History:  Social History   Socioeconomic History  . Marital status: Married    Spouse name: Not on file  . Number of children: Not on file  . Years of education: Not on file  . Highest education level: Not on file  Occupational History  . Not on file  Social Needs  . Financial resource strain: Not on file  . Food insecurity    Worry: Not on file    Inability: Not on file  . Transportation needs    Medical: Not on file    Non-medical: Not on file  Tobacco Use  . Smoking status: Former Games developer  . Smokeless tobacco: Never Used  Substance and Sexual Activity  . Alcohol use: Yes  . Drug use: No  . Sexual activity: Yes    Birth control/protection: None  Lifestyle  . Physical activity    Days per week: Not on file    Minutes per session: Not on file  . Stress: Not on file  Relationships  . Social Musician on phone:  Not on file    Gets together: Not on file    Attends religious service: Not on file    Active member of club or organization: Not on file    Attends meetings of clubs or organizations: Not on file    Relationship status: Not on file  . Intimate partner violence    Fear of current or ex partner: Not on file    Emotionally abused: Not on file    Physically abused: Not on file    Forced sexual activity: Not on file  Other Topics Concern  . Not on file  Social History Narrative  . Not on file    Allergies:  No Known Allergies  Medications: Prior to Admission medications   Medication Sig Start Date End Date Taking? Authorizing Provider  Prenatal Vit-Fe Fumarate-FA (PRENATAL VITAMIN AND MINERAL PO)  08/22/18  Yes [provider]  sertraline (ZOLOFT) 50 MG tablet Take by mouth. 12/04/18 12/04/19 Yes [provider]  clonazePAM (KLONOPIN) 0.5 MG tablet  01/18/18   [provider]  Doxylamine-Pyridoxine (DICLEGIS) 10-10 MG TBEC Take 2 tablets by mouth at bedtime. If symptoms persist, add one tablet in the morning and one in the afternoon 02/19/19   Vena Austria, MD  hydrochlorothiazide (HYDRODIURIL) 25 MG tablet Take by mouth. 07/13/17 07/13/18  [provider]  topiramate (TOPAMAX) 50 MG tablet  08/29/18   [provider]    Physical Exam Vitals:  Vitals:   02/18/19 1034  BP: 120/80  Pulse: 86   No LMP recorded. (Menstrual status: Oral contraceptives).  General: NAD, well nourished appears stated age 20: normocephalic, anicteric Extremities: no edema, erythema, or tenderness Neurologic: Grossly intact Psychiatric: mood appropriate, affect full  TVUS showing early  Intrauterine gestational sac and small yolk sac. No fetal embryo or heartones at present  Female chaperone present for pelvic  portions of the physical exam  Assessment: 33 y.o. G1P0010 follow up HCG  Plan: Problem List Items Addressed This Visit      Other    Establish gestational age, ultrasound - Primary   Relevant Orders   US OB Follow Up    Other Visit Diagnoses    Pregnancy with inconclusive fetal viability, single or unspecified fetus       Relevant Orders   Beta hCG quant (ref lab) (Completed)     1) Pregnancy inconclusive viability - will obtain repeat HCG, follow up 1 week.  Discussed closer follow up as an option given prior pregnancy loss.  2) A total of 15 minutes were spent in face-to-face contact with the patient during this encounter with over half of that time devoted to counseling and coordination of care.  3) Return in about 1 week (around 02/25/2019) for NOB and dating scan Lajoyce Tamura.   Malachy Mood, MD, [facility], [facility] 02/19/2019, 12:49 PM

## 2019-02-19 ENCOUNTER — Other Ambulatory Visit: Payer: Self-pay | Admitting: Obstetrics and Gynecology

## 2019-02-19 LAB — BETA HCG QUANT (REF LAB): hCG Quant: 7580 m[IU]/mL

## 2019-02-19 MED ORDER — DOXYLAMINE-PYRIDOXINE 10-10 MG PO TBEC: 2.0000 | DELAYED_RELEASE_TABLET | Freq: Every day | ORAL | 5 refills | Status: DC

## 2019-02-28 ENCOUNTER — Other Ambulatory Visit (HOSPITAL_COMMUNITY)
Admission: RE | Admit: 2019-02-28 | Discharge: 2019-02-28 | Disposition: A | Discharge status: Home / Self Care | Payer: No Typology Code available for payment source | Source: Ambulatory Visit | Attending: Maternal Newborn | Admitting: Maternal Newborn

## 2019-02-28 ENCOUNTER — Ambulatory Visit (INDEPENDENT_AMBULATORY_CARE_PROVIDER_SITE_OTHER): Payer: No Typology Code available for payment source | Admitting: Maternal Newborn

## 2019-02-28 ENCOUNTER — Other Ambulatory Visit: Payer: Self-pay

## 2019-02-28 ENCOUNTER — Encounter: Payer: Self-pay | Admitting: Maternal Newborn

## 2019-02-28 ENCOUNTER — Ambulatory Visit (INDEPENDENT_AMBULATORY_CARE_PROVIDER_SITE_OTHER): Payer: No Typology Code available for payment source

## 2019-02-28 VITALS — BP 100/60 | Wt 187.0 lb

## 2019-02-28 DIAGNOSIS — Z3481 Encounter for supervision of other normal pregnancy, first trimester: Secondary | ICD-10-CM

## 2019-02-28 DIAGNOSIS — N8312 Corpus luteum cyst of left ovary: Secondary | ICD-10-CM

## 2019-02-28 DIAGNOSIS — Z3A08 8 weeks gestation of pregnancy: Secondary | ICD-10-CM | POA: Diagnosis not present

## 2019-02-28 DIAGNOSIS — Z3A01 Less than 8 weeks gestation of pregnancy: Secondary | ICD-10-CM | POA: Diagnosis not present

## 2019-02-28 DIAGNOSIS — O3481 Maternal care for other abnormalities of pelvic organs, first trimester: Secondary | ICD-10-CM | POA: Diagnosis not present

## 2019-02-28 DIAGNOSIS — Z3480 Encounter for supervision of other normal pregnancy, unspecified trimester: Secondary | ICD-10-CM | POA: Diagnosis present

## 2019-02-28 DIAGNOSIS — Z3689 Encounter for other specified antenatal screening: Secondary | ICD-10-CM

## 2019-02-28 NOTE — Progress Notes (Signed)
02/28/2019   Chief Complaint: Desires prenatal care.  Transfer of Care Patient: No  History of Present Illness: Katherine Graham is a 33 y.o. G2P0010 at 5852w1d based on Patient's last menstrual period on 01/02/2019, with an Estimated Date of Delivery: 10/09/19, with the above CC.   Her periods were: irregular periods She has Positive signs or symptoms of nausea/vomiting of pregnancy; has improved with Diclegis. She has Negative signs or symptoms of miscarriage or preterm labor She identifies Negative Zika risk factors for her and her partner On any different medications around the time she conceived/early pregnancy: Yes, recently started Synthroid History of varicella: Yes   Review of Systems  Constitutional: Positive for malaise/fatigue.       Change in appetite  HENT: Negative.   Eyes: Negative.   Respiratory: Positive for shortness of breath. Negative for cough and wheezing.        No difficulty breathing  Cardiovascular: Negative for chest pain and palpitations.  Gastrointestinal: Positive for constipation, diarrhea and nausea.  Genitourinary: Positive for frequency.  Musculoskeletal: Negative.   Skin: Positive for itching.  Neurological: Positive for headaches.  Endo/Heme/Allergies: Negative.        Heat/cold intolerance  Psychiatric/Behavioral: Negative.   Breast ROS: Positive for breast tenderness and increased texture of mammary glands (no masses)  Review of systems was otherwise negative, except as stated in the above HPI.  OBGYN History: As per HPI. OB History  Gravida Para Term Preterm AB Living  2 0 0 0 1 0  SAB TAB Ectopic Multiple Live Births               # Outcome Date GA Lbr Len/2nd Weight Sex Delivery Anes PTL Lv  2 Current           1 AB             Any issues with any prior pregnancies: no Any prior children are healthy, doing well, without any problems or issues: not applicable History of pap smears: Yes. Last pap smear 02/07/2018. NILM/HPV Negative  History of STIs: No   Past Medical History: History reviewed. No pertinent past medical history.  Past Surgical History: Past Surgical History:  Procedure Laterality Date  . INDUCED ABORTION     Age 33  . TONSILLECTOMY      Family History:  Family History  Problem Relation Age of Onset  . Colon cancer Maternal Grandmother 50   She does not have a history of any female cancers, bleeding or blood clotting disorders.  She has a maternal uncle with an intellectual disability.  No other history of birth defects or genetic disorders in her or the FOB's history.  Social History:  Social History   Socioeconomic History  . Marital status: Married    Spouse name: Not on file  . Number of children: Not on file  . Years of education: Not on file  . Highest education level: Not on file  Occupational History  . Not on file  Social Needs  . Financial resource strain: Not on file  . Food insecurity    Worry: Not on file    Inability: Not on file  . Transportation needs    Medical: Not on file    Non-medical: Not on file  Tobacco Use  . Smoking status: Former Games developermoker  . Smokeless tobacco: Never Used  Substance and Sexual Activity  . Alcohol use: Yes  . Drug use: No  . Sexual activity: Yes    Birth control/protection:  None  Lifestyle  . Physical activity    Days per week: Not on file    Minutes per session: Not on file  . Stress: Not on file  Relationships  . Social Herbalist on phone: Not on file    Gets together: Not on file    Attends religious service: Not on file    Active member of club or organization: Not on file    Attends meetings of clubs or organizations: Not on file    Relationship status: Not on file  . Intimate partner violence    Fear of current or ex partner: Not on file    Emotionally abused: Not on file    Physically abused: Not on file    Forced sexual activity: Not on file  Other Topics Concern  . Not on file  Social History Narrative   . Not on file   Any cats in the household: yes, discussed avoiding cat litter and feces and having someone else change the litter box to reduce risk of toxoplasmosis.  Allergy: No Known Allergies  Current Outpatient Medications:  Current Outpatient Medications:  .  Doxylamine-Pyridoxine (DICLEGIS) 10-10 MG TBEC, Take 2 tablets by mouth at bedtime. If symptoms persist, add one tablet in the morning and one in the afternoon, Disp: 100 tablet, Rfl: 5 .  clonazePAM (KLONOPIN) 0.5 MG tablet, , Disp: , Rfl: 3 .  hydrochlorothiazide (HYDRODIURIL) 25 MG tablet, Take by mouth., Disp: , Rfl:  .  Prenatal Vit-Fe Fumarate-FA (PRENATAL VITAMIN AND MINERAL PO), , Disp: , Rfl:  .  sertraline (ZOLOFT) 50 MG tablet, Take by mouth., Disp: , Rfl:  .  topiramate (TOPAMAX) 50 MG tablet, , Disp: , Rfl:    Physical Exam:   BP 100/60   Wt 187 lb (84.8 kg)   LMP 01/02/2019   BMI 27.62 kg/m  Body mass index is 27.62 kg/m. Constitutional: Well nourished, well developed female in no acute distress.  Neck:  Supple, normal appearance, and no thyromegaly  Cardiovascular: S1, S2 normal, no murmur, rub or gallop, regular rate and rhythm Respiratory:  Clear to auscultation bilateral. Normal respiratory effort Abdomen: positive bowel sounds and no masses, hernias; diffusely non tender to palpation, non distended Breasts: patient declines to have breast exam. Neuro/Psych:  Normal mood and affect.  Skin:  Warm and dry.  Lymphatic:  No inguinal lymphadenopathy.   External genitalia, Bartholin's glands, Urethra, Skene's glands: within normal limits Vagina: within normal limits and with no blood in the vault  Cervix: not visualized, no CMT Uterus:  enlarged Adnexa:  no mass, fullness, tenderness   Assessment: Katherine Graham is a 33 y.o. G2P0010 at [redacted]w[redacted]d based on Patient's last menstrual period was 01/02/2019, with an Estimated Date of Delivery: 10/09/2019, presenting for prenatal care.  Plan:  1) Avoid alcoholic  beverages. 2) Patient encouraged not to smoke.  3) Discontinue the use of all non-medicinal drugs and chemicals.  4) Take prenatal vitamins daily.  5) Seatbelt use advised 6) Nutrition, food safety, and exercise discussed. 7) Hospital and practice style delivering at [facility] discussed  8) Patient is asked about travel to areas at risk for the [place] virus, and counseled to avoid travel and exposure to mosquitoes or partners who may have themselves been exposed to the virus.   9) Genetic Screening, such as with 1st Trimester Screening, cell free fetal DNA, AFP testing, and Ultrasound, is discussed with patient. She plans to have genetic testing this pregnancy. 10) NOB labs  today 11) Dating ultrasound today showed a singleton IUP at 7 weeks with FHR of 163 bpm. The EDD was changed today to 10/17/2019 per ACOG Guidelines.  Problem list reviewed and updated.  Marcelyn Bruins, CNM [facility], [facility] 02/28/2019  3:46 PM

## 2019-03-01 DIAGNOSIS — Z3480 Encounter for supervision of other normal pregnancy, unspecified trimester: Secondary | ICD-10-CM | POA: Insufficient documentation

## 2019-03-01 LAB — URINE DRUG PANEL 7
Amphetamines, Urine: NEGATIVE ng/mL
Barbiturate Quant, Ur: NEGATIVE ng/mL
Benzodiazepine Quant, Ur: NEGATIVE ng/mL
Cannabinoid Quant, Ur: NEGATIVE ng/mL
Cocaine (Metab.): NEGATIVE ng/mL
Opiate Quant, Ur: NEGATIVE ng/mL
PCP Quant, Ur: NEGATIVE ng/mL

## 2019-03-02 LAB — RPR+RH+ABO+RUB AB+AB SCR+CB...
Antibody Screen: NEGATIVE
HIV Screen 4th Generation wRfx: NONREACTIVE
Hematocrit: 38.3 % (ref 34.0–46.6)
Hemoglobin: 13.2 g/dL (ref 11.1–15.9)
Hepatitis B Surface Ag: NEGATIVE
MCH: 31.6 pg (ref 26.6–33.0)
MCHC: 34.5 g/dL (ref 31.5–35.7)
MCV: 92 fL (ref 79–97)
Platelets: 279 10*3/uL (ref 150–450)
RBC: 4.18 x10E6/uL (ref 3.77–5.28)
RDW: 12.3 % (ref 11.7–15.4)
RPR Ser Ql: NONREACTIVE
Rh Factor: POSITIVE
Rubella Antibodies, IGG: 5.92 index (ref >0.99)
Varicella zoster IgG: >4000 index (ref >165)
WBC: 10.5 10*3/uL (ref 3.4–10.8)

## 2019-03-02 LAB — URINE CULTURE

## 2019-03-04 LAB — CERVICOVAGINAL ANCILLARY ONLY
Chlamydia: NEGATIVE
Comment: NEGATIVE
Comment: NORMAL
Neisseria Gonorrhea: NEGATIVE

## 2019-03-07 ENCOUNTER — Telehealth: Payer: Self-pay | Admitting: Obstetrics and Gynecology

## 2019-03-07 NOTE — Telephone Encounter (Signed)
Pt states she has been having diarhhea and vomitting this morning. But is feeling better since eating some soup. She is able to keep water down. Pt will call if she starts to feel worse.

## 2019-03-07 NOTE — Telephone Encounter (Signed)
Patient calling stating she left a message on triage and missed a call.  Aware nurses at lunch and will return call asap.

## 2019-03-21 ENCOUNTER — Encounter: Payer: No Typology Code available for payment source | Admitting: Obstetrics and Gynecology

## 2019-03-24 ENCOUNTER — Telehealth: Payer: Self-pay

## 2019-03-24 NOTE — Telephone Encounter (Signed)
Pt calling triage c/o bleeding after IC. Lm with pt to call back with a little more info. She has appt 11/18 that she def needs to keep.

## 2019-03-26 ENCOUNTER — Other Ambulatory Visit: Payer: Self-pay

## 2019-03-26 ENCOUNTER — Encounter: Payer: Self-pay | Admitting: Maternal Newborn

## 2019-03-26 ENCOUNTER — Ambulatory Visit (INDEPENDENT_AMBULATORY_CARE_PROVIDER_SITE_OTHER): Payer: No Typology Code available for payment source | Admitting: Maternal Newborn

## 2019-03-26 VITALS — BP 110/70 | Wt 192.0 lb

## 2019-03-26 DIAGNOSIS — G4700 Insomnia, unspecified: Secondary | ICD-10-CM | POA: Diagnosis not present

## 2019-03-26 DIAGNOSIS — O99891 Other specified diseases and conditions complicating pregnancy: Secondary | ICD-10-CM | POA: Diagnosis not present

## 2019-03-26 DIAGNOSIS — Z3A10 10 weeks gestation of pregnancy: Secondary | ICD-10-CM | POA: Diagnosis not present

## 2019-03-26 DIAGNOSIS — Z3480 Encounter for supervision of other normal pregnancy, unspecified trimester: Secondary | ICD-10-CM

## 2019-03-26 LAB — POCT URINALYSIS DIPSTICK OB
Glucose, UA: NEGATIVE
POC,PROTEIN,UA: NEGATIVE

## 2019-03-26 MED ORDER — HYDROXYZINE HCL 25 MG PO TABS: 25.0000 mg | ORAL_TABLET | Freq: Every day | ORAL | 0 refills | Status: DC

## 2019-03-26 NOTE — Patient Instructions (Signed)
First Trimester of Pregnancy The first trimester of pregnancy is from week 1 until the end of week 13 (months 1 through 3). A week after a sperm fertilizes an egg, the egg will implant on the wall of the uterus. This embryo will begin to develop into a baby. Genes from you and your partner will form the baby. The female genes will determine whether the baby will be a boy or a girl. At 6-8 weeks, the eyes and face will be formed, and the heartbeat can be seen on ultrasound. At the end of 12 weeks, all the baby's organs will be formed. Now that you are pregnant, you will want to do everything you can to have a healthy baby. Two of the most important things are to get good prenatal care and to follow your health care provider's instructions. Prenatal care is all the medical care you receive before the baby's birth. This care will help prevent, find, and treat any problems during the pregnancy and childbirth. Body changes during your first trimester Your body goes through many changes during pregnancy. The changes vary from woman to woman.  You may gain or lose a couple of pounds at first.  You may feel sick to your stomach (nauseous) and you may throw up (vomit). If the vomiting is uncontrollable, call your health care provider.  You may tire easily.  You may develop headaches that can be relieved by medicines. All medicines should be approved by your health care provider.  You may urinate more often. Painful urination may mean you have a bladder infection.  You may develop heartburn as a result of your pregnancy.  You may develop constipation because certain hormones are causing the muscles that push stool through your intestines to slow down.  You may develop hemorrhoids or swollen veins (varicose veins).  Your breasts may begin to grow larger and become tender. Your nipples may stick out more, and the tissue that surrounds them (areola) may become darker.  Your gums may bleed and may be  sensitive to brushing and flossing.  Dark spots or blotches (chloasma, mask of pregnancy) may develop on your face. This will likely fade after the baby is born.  Your menstrual periods will stop.  You may have a loss of appetite.  You may develop cravings for certain kinds of food.  You may have changes in your emotions from day to day, such as being excited to be pregnant or being concerned that something may go wrong with the pregnancy and baby.  You may have more vivid and strange dreams.  You may have changes in your hair. These can include thickening of your hair, rapid growth, and changes in texture. Some women also have hair loss during or after pregnancy, or hair that feels dry or thin. Your hair will most likely return to normal after your baby is born. What to expect at prenatal visits During a routine prenatal visit:  You will be weighed to make sure you and the baby are growing normally.  Your blood pressure will be taken.  Your abdomen will be measured to track your baby's growth.  The fetal heartbeat will be listened to between weeks 10 and 14 of your pregnancy.  Test results from any previous visits will be discussed. Your health care provider may ask you:  How you are feeling.  If you are feeling the baby move.  If you have had any abnormal symptoms, such as leaking fluid, bleeding, severe headaches, or abdominal   cramping.  If you are using any tobacco products, including cigarettes, chewing tobacco, and electronic cigarettes.  If you have any questions. Other tests that may be performed during your first trimester include:  Blood tests to find your blood type and to check for the presence of any previous infections. The tests will also be used to check for low iron levels (anemia) and protein on red blood cells (Rh antibodies). Depending on your risk factors, or if you previously had diabetes during pregnancy, you may have tests to check for high blood sugar  that affects pregnant women (gestational diabetes).  Urine tests to check for infections, diabetes, or protein in the urine.  An ultrasound to confirm the proper growth and development of the baby.  Fetal screens for spinal cord problems (spina bifida) and Down syndrome.  HIV (human immunodeficiency virus) testing. Routine prenatal testing includes screening for HIV, unless you choose not to have this test.  You may need other tests to make sure you and the baby are doing well. Follow these instructions at home: Medicines  Follow your health care provider's instructions regarding medicine use. Specific medicines may be either safe or unsafe to take during pregnancy.  Take a prenatal vitamin that contains at least 600 micrograms (mcg) of folic acid.  If you develop constipation, try taking a stool softener if your health care provider approves. Eating and drinking   Eat a balanced diet that includes fresh fruits and vegetables, whole grains, good sources of protein such as meat, eggs, or tofu, and low-fat dairy. Your health care provider will help you determine the amount of weight gain that is right for you.  Avoid raw meat and uncooked cheese. These carry germs that can cause birth defects in the baby.  Eating four or five small meals rather than three large meals a day may help relieve nausea and vomiting. If you start to feel nauseous, eating a few soda crackers can be helpful. Drinking liquids between meals, instead of during meals, also seems to help ease nausea and vomiting.  Limit foods that are high in fat and processed sugars, such as fried and sweet foods.  To prevent constipation: ? Eat foods that are high in fiber, such as fresh fruits and vegetables, whole grains, and beans. ? Drink enough fluid to keep your urine clear or pale yellow. Activity  Exercise only as directed by your health care provider. Most women can continue their usual exercise routine during  pregnancy. Try to exercise for 30 minutes at least 5 days a week. Exercising will help you: ? Control your weight. ? Stay in shape. ? Be prepared for labor and delivery.  Experiencing pain or cramping in the lower abdomen or lower back is a good sign that you should stop exercising. Check with your health care provider before continuing with normal exercises.  Try to avoid standing for long periods of time. Move your legs often if you must stand in one place for a long time.  Avoid heavy lifting.  Wear low-heeled shoes and practice good posture.  You may continue to have sex unless your health care provider tells you not to. Relieving pain and discomfort  Wear a good support bra to relieve breast tenderness.  Take warm sitz baths to soothe any pain or discomfort caused by hemorrhoids. Use hemorrhoid cream if your health care provider approves.  Rest with your legs elevated if you have leg cramps or low back pain.  If you develop varicose veins in   your legs, wear support hose. Elevate your feet for 15 minutes, 3-4 times a day. Limit salt in your diet. Prenatal care  Schedule your prenatal visits by the twelfth week of pregnancy. They are usually scheduled monthly at first, then more often in the last 2 months before delivery.  Write down your questions. Take them to your prenatal visits.  Keep all your prenatal visits as told by your health care provider. This is important. Safety  Wear your seat belt at all times when driving.  Make a list of emergency phone numbers, including numbers for family, friends, the hospital, and police and fire departments. General instructions  Ask your health care provider for a referral to a local prenatal education class. Begin classes no later than the beginning of month 6 of your pregnancy.  Ask for help if you have counseling or nutritional needs during pregnancy. Your health care provider can offer advice or refer you to specialists for help  with various needs.  Do not use hot tubs, steam rooms, or saunas.  Do not douche or use tampons or scented sanitary pads.  Do not cross your legs for long periods of time.  Avoid cat litter boxes and soil used by cats. These carry germs that can cause birth defects in the baby and possibly loss of the fetus by miscarriage or stillbirth.  Avoid all smoking, herbs, alcohol, and medicines not prescribed by your health care provider. Chemicals in these products affect the formation and growth of the baby.  Do not use any products that contain nicotine or tobacco, such as cigarettes and e-cigarettes. If you need help quitting, ask your health care provider. You may receive counseling support and other resources to help you quit.  Schedule a dentist appointment. At home, brush your teeth with a soft toothbrush and be gentle when you floss. Contact a health care provider if:  You have dizziness.  You have mild pelvic cramps, pelvic pressure, or nagging pain in the abdominal area.  You have persistent nausea, vomiting, or diarrhea.  You have a bad smelling vaginal discharge.  You have pain when you urinate.  You notice increased swelling in your face, hands, legs, or ankles.  You are exposed to fifth disease or chickenpox.  You are exposed to German measles (rubella) and have never had it. Get help right away if:  You have a fever.  You are leaking fluid from your vagina.  You have spotting or bleeding from your vagina.  You have severe abdominal cramping or pain.  You have rapid weight gain or loss.  You vomit blood or material that looks like coffee grounds.  You develop a severe headache.  You have shortness of breath.  You have any kind of trauma, such as from a fall or a car accident. Summary  The first trimester of pregnancy is from week 1 until the end of week 13 (months 1 through 3).  Your body goes through many changes during pregnancy. The changes vary from  woman to woman.  You will have routine prenatal visits. During those visits, your health care provider will examine you, discuss any test results you may have, and talk with you about how you are feeling. This information is not intended to replace advice given to you by your health care provider. Make sure you discuss any questions you have with your health care provider. Document Released: 04/18/2001 Document Revised: 04/06/2017 Document Reviewed: 04/05/2016 Elsevier Patient Education  2020 [employer]vier [employer]  

## 2019-03-26 NOTE — Progress Notes (Signed)
Routine Prenatal Care Visit  Subjective  Katherine Graham is a 33 y.o. G2P0010 at [redacted]w[redacted]d being seen today for ongoing prenatal care.  She is currently monitored for the following issues for this low-risk pregnancy and has Cervical radiculopathy; GAD (generalized anxiety disorder); GERD without esophagitis; Migraine without aura and without status migrainosus, not intractable; and Supervision of other normal pregnancy, antepartum on their problem list.  ----------------------------------------------------------------------------------- Patient reports pain during intercourse and two episodes of bleeding after intercourse. She has felt short of breath and symptoms like she may have a panic attack when she is out in public wearing a mask. She has been having difficulty falling asleep nightly and is feeling fatigued. Vag. Bleeding: None.   ----------------------------------------------------------------------------------- The following portions of the patient's history were reviewed and updated as appropriate: allergies, current medications, past family history, past medical history, past social history, past surgical history and problem list. Problem list updated.  Objective  Blood pressure 110/70, weight 192 lb (87.1 kg), last menstrual period 01/02/2019. Pregravid weight 176 lb (79.8 kg) Total Weight Gain 16 lb (7.258 kg) Urinalysis: Urine dipstick shows negative for glucose, protein.   General:  Alert, oriented and cooperative. Patient is in no acute distress.  Skin: Skin is warm and dry. No rash noted.   Cardiovascular: Normal heart rate noted  Respiratory: Normal respiratory effort, no problems with respiration noted  Abdomen: Soft, gravid, appropriate for gestational age. Pain/Pressure: Absent     Pelvic:  Speculum exam performed, no bleeding present, cervix visually closed        Extremities: Normal range of motion.     Mental Status: Normal mood and affect. Normal behavior. Normal  judgment and thought content.     Assessment   33 y.o. G2P0010 at [redacted]w[redacted]d EDD 10/17/2019, by Ultrasound presenting for a routine prenatal visit.  Plan   pregnancy2 Problems (from 01/06/19 to present)    Problem Noted Resolved   Supervision of other normal pregnancy, antepartum 03/01/2019 by Oswaldo Conroy, CNM No   Overview Signed 03/01/2019  6:40 PM by Oswaldo Conroy, CNM    [facility]inic [facility] Prenatal Labs  Dating Ultrasound at 7 w Blood type:     Genetic Screen 1 Screen:    AFP:     Quad:     NIPS: Antibody:   Anatomic [place]  Rubella:   Varicella:    GTT Early:               Third trimester:  RPR:     Rhogam  HBsAg:     TDaP vaccine                       Flu Shot: HIV:     Baby Food                                GBS:   Contraception  Pap: 02/07/2018, NILM/HPV Negative  CBB     CS/VBAC    Support Person               No blood present on exam today, cervix appears normal. Reassurance given about bleeding after intercourse, she will call and come in for appointment/ultrasound if she experiences bleeding at other times.  Would like to try hydroxyzine to see if it can help her with insomnia.  Please refer to After Visit Summary for other counseling recommendations.   Return in about 4  weeks (around 04/23/2019) for ROB.  Avel Sensor, CNM 03/26/2019  8:46 AM

## 2019-03-26 NOTE — Progress Notes (Signed)
C/o had some bleeding last Wed to [employer] and Sun evening to yesterday am, both after IC, heavier Sunday to yesterday like it was pouring; not able to sleep.rj

## 2019-03-31 NOTE — Telephone Encounter (Signed)
I am forwarding this message to you, Jaclyn Shaggy

## 2019-04-23 ENCOUNTER — Encounter: Payer: No Typology Code available for payment source | Admitting: Advanced Practice Midwife

## 2019-05-09 NOTE — L&D Delivery Note (Signed)
Delivery Note  First Stage: Labor onset: 1200 10/24/19 Augmentation: cytotec, Pitocin Analgesia Eliezer Lofts intrapartum: epidural SROM at 2115 10/24/19  Second Stage: Complete dilation at 0218 Onset of pushing at 0230, pushing until 0430, then restarted pushing from 0500 until delivery FHR second stage Cat I   Delivery of a viable female infant on 10/25/19 at 0658 by CNM delivery of fetal head in OA position with restitution to ROA. Tight nuchal cord x 1;  Anterior then posterior shoulders delivered easily with gentle downward traction. Baby placed on mom's chest, and attended to by peds.  Cord double clamped after cessation of pulsation, cut by FOB Cord blood sample collected   Third Stage: Placenta delivered spontaneously intact with [facility] @ [phone] Placenta disposition: routine disposal Active 3rd stage management with gentle cord traction and Pitocin at delivery of infant, Uterine tone boggy, firmed with massage but continued to have brisk bleeding. Cytotec PR given, followed by Methergine 0.2mg  IM and Transexamic acid 1000mg  IVPB started. Bimanual compression used to slow bleeding again, then Hemabate given IM.   2nd deg perineal laceration identified, no cervical laceration noted.  Anesthesia for repair: epidural Repair 2-0 Vicryl CT-1  Complications:  QBL , Postpartum hemorrhage due to atony  Mom to postpartum.  Baby to Couplet care / Skin to Skin.  Newborn: Birth Weight: pending Apgar Scores: 8/9 Feeding planned: breast

## 2019-05-29 ENCOUNTER — Other Ambulatory Visit: Payer: Self-pay | Admitting: Surgery

## 2019-06-01 ENCOUNTER — Other Ambulatory Visit: Payer: Self-pay

## 2019-06-01 ENCOUNTER — Observation Stay
Admission: EM | Admit: 2019-06-01 | Discharge: 2019-06-01 | Disposition: A | Discharge status: Home / Self Care | Payer: No Typology Code available for payment source | Attending: Obstetrics & Gynecology | Admitting: Obstetrics & Gynecology

## 2019-06-01 DIAGNOSIS — O368120 Decreased fetal movements, second trimester, not applicable or unspecified: Secondary | ICD-10-CM | POA: Diagnosis not present

## 2019-06-01 DIAGNOSIS — Z79890 Hormone replacement therapy (postmenopausal): Secondary | ICD-10-CM | POA: Insufficient documentation

## 2019-06-01 DIAGNOSIS — Z87891 Personal history of nicotine dependence: Secondary | ICD-10-CM | POA: Diagnosis not present

## 2019-06-01 DIAGNOSIS — Z3A20 20 weeks gestation of pregnancy: Secondary | ICD-10-CM | POA: Diagnosis not present

## 2019-06-01 DIAGNOSIS — O368190 Decreased fetal movements, unspecified trimester, not applicable or unspecified: Secondary | ICD-10-CM | POA: Diagnosis present

## 2019-06-01 DIAGNOSIS — Z79899 Other long term (current) drug therapy: Secondary | ICD-10-CM | POA: Insufficient documentation

## 2019-06-01 MED ORDER — ACETAMINOPHEN 325 MG PO TABS: 650.0000 mg | ORAL_TABLET | ORAL | Status: DC | PRN

## 2019-06-01 NOTE — OB Triage Note (Signed)
Pt hasnt felt baby move x 1.5 days

## 2019-06-01 NOTE — Discharge Summary (Signed)
Essense Adean Milosevic is a 34 y.o. female. She is at [redacted]w[redacted]d gestation. Patient's last menstrual period was 01/02/2019. Estimated Date of Delivery: 10/17/19  Prenatal care site: [facility]   Chief complaint: decreased fetal movement  States she has not felt her baby move since yesterday afternoon.  Report feeling daily movement but hasn't felt him move today.  States that she has eaten recently.   S: Resting comfortably. no CTX, no VB.no LOF.   Maternal Medical History:  Past Medical Hx:  has no past medical history on file.    Past Surgical Hx:  has a past surgical history that includes Tonsillectomy and Induced abortion.   No Known Allergies   Prior to Admission medications   Medication Sig Start Date End Date Taking? Authorizing Provider  clonazePAM (KLONOPIN) 0.5 MG tablet  01/18/18   [provider]  Doxylamine-Pyridoxine (DICLEGIS) 10-10 MG TBEC Take 2 tablets by mouth at bedtime. If symptoms persist, add one tablet in the morning and one in the afternoon 02/19/19   Vena Austria, MD  hydrOXYzine (ATARAX/VISTARIL) 25 MG tablet Take 1 tablet (25 mg total) by mouth at bedtime. 03/26/19   Oswaldo Conroy, CNM  levothyroxine (SYNTHROID) 50 MCG tablet Take 50 mcg by mouth daily before breakfast. 02/20/19 02/20/20  [provider]  Prenatal Vit-Fe Fumarate-FA (PRENATAL VITAMIN AND MINERAL PO)  08/22/18   [provider]  Probiotic Product (PROBIOTIC-10 ULTIMATE) CAPS Take 1 capsule by mouth daily.    [provider]  sertraline (ZOLOFT) 50 MG tablet Take by mouth. 12/04/18 12/04/19  [provider]  topiramate (TOPAMAX) 50 MG tablet  08/29/18   [provider]    Social History: She  reports that she has quit smoking. She has never used smokeless tobacco. She reports current alcohol use. She reports that she does not use drugs.  Family History: family history includes Colon cancer (age of onset: 2) in her maternal  grandmother.   Review of Systems: A full review of systems was performed and negative except as noted in the HPI.    O:  BP 126/62   Pulse 96   Temp 98.1 F (36.7 C) (Oral)   Resp 16   Ht 5\' 9"  (1.753 m)   Wt 86.2 kg   LMP 01/02/2019   BMI 28.06 kg/m  No results found for this or any previous visit (from the past 48 hour(s)).   Constitutional: NAD, AAOx3  HE/ENT: extraocular movements grossly intact, moist mucous membranes CV: RRR PULM: nl respiratory effort, CTABL     Abd: gravid, non-tender, non-distended, soft      Ext: Non-tender, Nonedmeatous   Psych: mood appropriate, speech normal Pelvic deferred  Fetal montoring Baseline: 145bpm, distinguished from maternal pulse Decelerations absent Uterus soft to palpation  Bedside 01/04/2019 performed to assess fetal movement. Patient and spouse were able to visualize fetal movement on [place].   FHR by [place] 150bpm   Assessment: 34 y.o. [redacted]w[redacted]d here for antenatal surveillance during pregnancy.  Principle diagnosis: reassuring FHR, appropriate for gestational age.   Plan:  Fetal Wellbeing: FHR appropriate for gestational age   Reassurance given  D/c home stable, precautions reviewed, follow-up as scheduled.   ----- [redacted]w[redacted]d, CNM Certified Nurse Midwife [facility]

## 2019-07-01 MED FILL — LEVOTHYROXINE SODIUM 112 MC: 112 | 30 days supply | Qty: 30 | Fill #0

## 2019-07-28 MED FILL — LEVOTHYROXINE SODIUM 112 MC: 112 | 30 days supply | Qty: 30 | Fill #1

## 2019-08-25 MED FILL — LEVOTHYROXINE SODIUM 112 MC: 112 | 30 days supply | Qty: 30 | Fill #2

## 2019-09-14 ENCOUNTER — Observation Stay
Admission: EM | Admit: 2019-09-14 | Discharge: 2019-09-14 | Disposition: A | Discharge status: Home / Self Care | Payer: No Typology Code available for payment source | Attending: Certified Nurse Midwife | Admitting: Certified Nurse Midwife

## 2019-09-14 ENCOUNTER — Encounter: Payer: Self-pay | Admitting: Obstetrics and Gynecology

## 2019-09-14 DIAGNOSIS — O368190 Decreased fetal movements, unspecified trimester, not applicable or unspecified: Secondary | ICD-10-CM | POA: Diagnosis present

## 2019-09-14 DIAGNOSIS — Z79899 Other long term (current) drug therapy: Secondary | ICD-10-CM | POA: Diagnosis not present

## 2019-09-14 DIAGNOSIS — E039 Hypothyroidism, unspecified: Secondary | ICD-10-CM | POA: Diagnosis not present

## 2019-09-14 DIAGNOSIS — Z3A35 35 weeks gestation of pregnancy: Secondary | ICD-10-CM | POA: Insufficient documentation

## 2019-09-14 DIAGNOSIS — O99283 Endocrine, nutritional and metabolic diseases complicating pregnancy, third trimester: Secondary | ICD-10-CM | POA: Diagnosis not present

## 2019-09-14 DIAGNOSIS — O368130 Decreased fetal movements, third trimester, not applicable or unspecified: Secondary | ICD-10-CM | POA: Diagnosis not present

## 2019-09-14 DIAGNOSIS — O26893 Other specified pregnancy related conditions, third trimester: Secondary | ICD-10-CM | POA: Diagnosis not present

## 2019-09-14 DIAGNOSIS — Z3480 Encounter for supervision of other normal pregnancy, unspecified trimester: Secondary | ICD-10-CM

## 2019-09-14 HISTORY — DX: Hypothyroidism, unspecified: E03.9

## 2019-09-14 LAB — URINALYSIS, COMPLETE (UACMP) WITH MICROSCOPIC
Bilirubin Urine: NEGATIVE
Glucose, UA: NEGATIVE mg/dL
Hgb urine dipstick: NEGATIVE
Ketones, ur: NEGATIVE mg/dL
Leukocytes,Ua: NEGATIVE
Nitrite: NEGATIVE
Protein, ur: NEGATIVE mg/dL
Specific Gravity, Urine: 1.009 (ref 1.005–1.030)
pH: 6 (ref 5.0–8.0)

## 2019-09-14 NOTE — OB Triage Note (Signed)
Patient comes to OBS 2 with complaints of lower abdominal cramping, increased clear mucus discharge and decreased fetal movements.  Monitors placed and assessing.

## 2019-09-14 NOTE — Discharge Summary (Signed)
Katherine Graham is a 34 y.o. female. She is at [redacted]w[redacted]d gestation. Patient's last menstrual period was 01/02/2019. Estimated Date of Delivery: 10/17/19  Prenatal care site: [facility]  Chief complaint: decreased fetal movement Location: uterus/baby Onset/timing: yesterday Duration: yesterday, today Quality: less movement than usual Severity: n/a Aggravating or alleviating conditions: none Associated signs/symptoms: some lower abdominal cramping, clear mucousy discharge Context: Katherine Graham reports decreased fetal movement yesterday and today. Ultimately, her baby did start moving more regularly yesterday, but today has not been as active as usual. She also reports what she thinks is her mucous plug, along with increased clear mucousy discharge. Pictures show clear, mucousy discharge. She has also had some intermittent lower abdominal cramping, but nothing in any kind of pattern.   S: Resting comfortably.   She reports:  -active fetal movement -no leakage of fluid -no vaginal bleeding -no contractions  Maternal Medical History:   Past Medical History:  Diagnosis Date  . Hypothyroidism     Past Surgical History:  Procedure Laterality Date  . INDUCED ABORTION     Age 72  . TONSILLECTOMY      No Known Allergies  Prior to Admission medications   Medication Sig Start Date End Date Taking? Authorizing Provider  levothyroxine (SYNTHROID) 50 MCG tablet Take 50 mcg by mouth daily before breakfast. 02/20/19 02/20/20 Yes [provider]  loratadine (CLARITIN) 10 MG tablet Take 10 mg by mouth daily.   Yes [provider]  magnesium oxide (MAG-OX) 400 MG tablet Take 400 mg by mouth daily.   Yes [provider]  Prenatal Vit-Fe Fumarate-FA (PRENATAL VITAMIN AND MINERAL PO)  08/22/18  Yes [provider]  Probiotic Product (PROBIOTIC-10 ULTIMATE) CAPS Take 1 capsule by mouth daily.   Yes [provider]  vitamin B-12 (CYANOCOBALAMIN)  500 MCG tablet Take 500 mcg by mouth daily.   Yes [provider]  clonazePAM (KLONOPIN) 0.5 MG tablet  01/18/18   [provider]  Doxylamine-Pyridoxine (DICLEGIS) 10-10 MG TBEC Take 2 tablets by mouth at bedtime. If symptoms persist, add one tablet in the morning and one in the afternoon Patient not taking: Reported on 09/14/2019 02/19/19   Malachy Mood, MD  hydrOXYzine (ATARAX/VISTARIL) 25 MG tablet Take 1 tablet (25 mg total) by mouth at bedtime. Patient not taking: Reported on 09/14/2019 03/26/19   Rexene Agent, CNM  sertraline (ZOLOFT) 50 MG tablet Take by mouth. 12/04/18 12/04/19  [provider]  topiramate (TOPAMAX) 50 MG tablet  08/29/18   [provider]     Social History: She  reports that she has never smoked. She has never used smokeless tobacco. She reports previous alcohol use. She reports that she does not use drugs.  Family History: family history includes Colon cancer (age of onset: 75) in her maternal grandmother.    O:  BP 115/75 (BP Location: Left Arm)   Pulse 98   Temp (!) 97.4 F (36.3 C) (Oral)   Resp 14   Ht 5\' 9"  (1.753 m)   Wt 97.5 kg Comment: 215lbs  LMP 01/02/2019   BMI 31.75 kg/m  No results found for this or any previous visit (from the past 48 hour(s)).   NST:  Baseline: 130bpm Variability: moderate Accelerations: 15x15 present x >2 Decelerations: absent Time: 63mins Toco: quiet   A/P: 34 y.o. [redacted]w[redacted]d here for antenatal surveillance during pregnancy.  Principle diagnosis: decreased fetal movement  Labor  Not present  Fetal Wellbeing  Reactive NST, reassuring for GA  Patient reporting regular fetal movement here in triage  Intermittent cramping  No contractions on the monitoring  UA collected to evaluate for UTI  Encouraged hydration  D/c home stable, precautions reviewed, follow-up as scheduled.  Has routine prenatal visit in five days    Genia Del 09/14/2019 11:54  AM  ----- Genia Del, CNM Certified Nurse Midwife [facility], Department of OB/GYN [facility]

## 2019-09-14 NOTE — Discharge Instructions (Signed)
Please keep your next follow up appointment on next Friday. Continue to monitor fetal kick counts. Eat regularly scheduled meals and stay hydrated by drinking plenty of water.  If you have any questions or concerns you may call the on call provider.  You may also call the nurse's desk at the [facility] at [phone] for questions.  If you have urgent concerns please go to the nearest emergency department for evaluation.

## 2019-09-19 LAB — OB RESULTS CONSOLE GBS: GBS: NEGATIVE

## 2019-09-24 ENCOUNTER — Observation Stay
Admission: EM | Admit: 2019-09-24 | Discharge: 2019-09-24 | Disposition: A | Discharge status: Home / Self Care | Payer: No Typology Code available for payment source | Attending: Obstetrics and Gynecology | Admitting: Obstetrics and Gynecology

## 2019-09-24 ENCOUNTER — Encounter: Payer: Self-pay | Admitting: Obstetrics and Gynecology

## 2019-09-24 ENCOUNTER — Other Ambulatory Visit: Payer: Self-pay

## 2019-09-24 DIAGNOSIS — O99283 Endocrine, nutritional and metabolic diseases complicating pregnancy, third trimester: Secondary | ICD-10-CM | POA: Insufficient documentation

## 2019-09-24 DIAGNOSIS — Z79890 Hormone replacement therapy (postmenopausal): Secondary | ICD-10-CM | POA: Insufficient documentation

## 2019-09-24 DIAGNOSIS — Z3A36 36 weeks gestation of pregnancy: Secondary | ICD-10-CM | POA: Insufficient documentation

## 2019-09-24 DIAGNOSIS — Z3490 Encounter for supervision of normal pregnancy, unspecified, unspecified trimester: Secondary | ICD-10-CM

## 2019-09-24 DIAGNOSIS — Z79899 Other long term (current) drug therapy: Secondary | ICD-10-CM | POA: Diagnosis not present

## 2019-09-24 DIAGNOSIS — O212 Late vomiting of pregnancy: Secondary | ICD-10-CM | POA: Diagnosis not present

## 2019-09-24 DIAGNOSIS — O26893 Other specified pregnancy related conditions, third trimester: Secondary | ICD-10-CM | POA: Diagnosis not present

## 2019-09-24 DIAGNOSIS — R197 Diarrhea, unspecified: Secondary | ICD-10-CM | POA: Diagnosis not present

## 2019-09-24 DIAGNOSIS — O219 Vomiting of pregnancy, unspecified: Secondary | ICD-10-CM | POA: Diagnosis present

## 2019-09-24 DIAGNOSIS — E039 Hypothyroidism, unspecified: Secondary | ICD-10-CM | POA: Insufficient documentation

## 2019-09-24 DIAGNOSIS — Z3480 Encounter for supervision of other normal pregnancy, unspecified trimester: Secondary | ICD-10-CM

## 2019-09-24 LAB — URINALYSIS, COMPLETE (UACMP) WITH MICROSCOPIC
Bacteria, UA: NONE SEEN
Bilirubin Urine: NEGATIVE
Glucose, UA: NEGATIVE mg/dL
Hgb urine dipstick: NEGATIVE
Ketones, ur: NEGATIVE mg/dL
Leukocytes,Ua: NEGATIVE
Nitrite: NEGATIVE
Protein, ur: NEGATIVE mg/dL
Specific Gravity, Urine: 1.02 (ref 1.005–1.030)
Squamous Epithelial / HPF: NONE SEEN (ref 0–5)
pH: 6 (ref 5.0–8.0)

## 2019-09-24 LAB — CBC
HCT: 33.3 % — ABNORMAL LOW (ref 36.0–46.0)
Hemoglobin: 10.7 g/dL — ABNORMAL LOW (ref 12.0–15.0)
MCH: 28.2 pg (ref 26.0–34.0)
MCHC: 32.1 g/dL (ref 30.0–36.0)
MCV: 87.9 fL (ref 80.0–100.0)
Platelets: 247 10*3/uL (ref 150–400)
RBC: 3.79 MIL/uL — ABNORMAL LOW (ref 3.87–5.11)
RDW: 14.3 % (ref 11.5–15.5)
WBC: 10.9 10*3/uL — ABNORMAL HIGH (ref 4.0–10.5)
nRBC: 0 % (ref 0.0–0.2)

## 2019-09-24 LAB — PROTEIN / CREATININE RATIO, URINE
Creatinine, Urine: 113 mg/dL
Protein Creatinine Ratio: 0.1 mg/mg{Cre} (ref 0.00–0.15)
Total Protein, Urine: 11 mg/dL

## 2019-09-24 LAB — COMPREHENSIVE METABOLIC PANEL
ALT: 16 U/L (ref 0–44)
AST: 18 U/L (ref 15–41)
Albumin: 2.7 g/dL — ABNORMAL LOW (ref 3.5–5.0)
Alkaline Phosphatase: 97 U/L (ref 38–126)
Anion gap: 9 (ref 5–15)
BUN: 10 mg/dL (ref 6–20)
CO2: 19 mmol/L — ABNORMAL LOW (ref 22–32)
Calcium: 8.6 mg/dL — ABNORMAL LOW (ref 8.9–10.3)
Chloride: 107 mmol/L (ref 98–111)
Creatinine, Ser: 0.48 mg/dL (ref 0.44–1.00)
GFR calc Af Amer: >60 mL/min (ref >60)
GFR calc non Af Amer: >60 mL/min (ref >60)
Glucose, Bld: 99 mg/dL (ref 70–99)
Potassium: 3.7 mmol/L (ref 3.5–5.1)
Sodium: 135 mmol/L (ref 135–145)
Total Bilirubin: 0.6 mg/dL (ref 0.3–1.2)
Total Protein: 6 g/dL — ABNORMAL LOW (ref 6.5–8.1)

## 2019-09-24 LAB — LIPASE, BLOOD: Lipase: 23 U/L (ref 11–51)

## 2019-09-24 LAB — AMYLASE: Amylase: 40 U/L (ref 28–100)

## 2019-09-24 MED ORDER — ONDANSETRON 4 MG PO TBDP: 4.0000 mg | ORAL_TABLET | Freq: Three times a day (TID) | ORAL | 0 refills | Status: DC | PRN

## 2019-09-24 MED ORDER — LACTATED RINGERS IV BOLUS: 1000.0000 mL | Freq: Once | INTRAVENOUS | Status: DC

## 2019-09-24 MED ORDER — ACETAMINOPHEN 325 MG PO TABS: 650.0000 mg | ORAL_TABLET | ORAL | Status: DC | PRN

## 2019-09-24 MED ORDER — ONDANSETRON HCL 4 MG/2ML IJ SOLN: 4.0000 mg | Freq: Three times a day (TID) | INTRAMUSCULAR | Status: DC | PRN

## 2019-09-24 MED ORDER — LACTATED RINGERS IV SOLN: INTRAVENOUS | Status: DC

## 2019-09-24 MED ORDER — LACTATED RINGERS IV BOLUS
1000.0000 mL | Freq: Once | INTRAVENOUS | Status: DC
  Administered 2019-09-24: 1000 mL via INTRAVENOUS

## 2019-09-24 MED FILL — LEVOTHYROXINE SODIUM 112 MC: 112 | 30 days supply | Qty: 30 | Fill #3

## 2019-09-24 NOTE — OB Triage Note (Signed)
Pt G2P0, 36 weeks 5 days, arrived here through ED from [facility] to OBS 3, States contractions starting Sunday with diarhea and vomitting continuing today. Feeling "off" and having flu like symptoms, clamy, fatigue, dizzy, and feeling confused/spacy and complains of dehydration. VSS. Monitors applied and assessing.

## 2019-09-24 NOTE — Discharge Summary (Signed)
Katherine Graham is a 34 y.o. female. She is at [redacted]w[redacted]d gestation. Patient's last menstrual period was 01/02/2019. Estimated Date of Delivery: 10/17/19  Prenatal care site: [facility]  Chief complaint: GI symptoms Location: GI tract Onset/timing: this morning Duration: intermittent Quality: nausea, vomiting, diarrhea Severity: mild to moderate Aggravating or alleviating conditions: none Associated signs/symptoms: some dizziness, feeling fatigues, clammy Context: Katherine Graham reports bothersome GI symptoms. She has been nauseous with one episode of emesis this morning. She has also had three episodes of diarrhea throughout today. She has also been having some cramping/contractions. She reports also intermittently feeling clammy, sweaty, dizzy, and spacey; generally feeling "off."   S: Resting comfortably.   She reports:  -active fetal movement -no leakage of fluid -no vaginal bleeding  Maternal Medical History:   Past Medical History:  Diagnosis Date  . Hypothyroidism     Past Surgical History:  Procedure Laterality Date  . INDUCED ABORTION     Age 80  . TONSILLECTOMY      No Known Allergies  Prior to Admission medications   Medication Sig Start Date End Date Taking? Authorizing Provider  levothyroxine (SYNTHROID) 50 MCG tablet Take 50 mcg by mouth daily before breakfast. 02/20/19 02/20/20 Yes [provider]  loratadine (CLARITIN) 10 MG tablet Take 10 mg by mouth daily.   Yes [provider]  magnesium oxide (MAG-OX) 400 MG tablet Take 400 mg by mouth daily.   Yes [provider]  Prenatal Vit-Fe Fumarate-FA (PRENATAL VITAMIN AND MINERAL PO)  08/22/18  Yes [provider]  Probiotic Product (PROBIOTIC-10 ULTIMATE) CAPS Take 1 capsule by mouth daily.   Yes [provider]  vitamin B-12 (CYANOCOBALAMIN) 500 MCG tablet Take 500 mcg by mouth daily.   Yes [provider]  clonazePAM (KLONOPIN) 0.5 MG tablet   01/18/18   [provider]  Doxylamine-Pyridoxine (DICLEGIS) 10-10 MG TBEC Take 2 tablets by mouth at bedtime. If symptoms persist, add one tablet in the morning and one in the afternoon Patient not taking: Reported on 09/14/2019 02/19/19   Malachy Mood, MD  hydrOXYzine (ATARAX/VISTARIL) 25 MG tablet Take 1 tablet (25 mg total) by mouth at bedtime. Patient not taking: Reported on 09/14/2019 03/26/19   Rexene Agent, CNM  sertraline (ZOLOFT) 50 MG tablet Take by mouth. 12/04/18 12/04/19  [provider]  topiramate (TOPAMAX) 50 MG tablet  08/29/18   [provider]     Social History: She  reports that she has never smoked. She has never used smokeless tobacco. She reports previous alcohol use. She reports that she does not use drugs.  Family History: family history includes Colon cancer (age of onset: 5) in her maternal grandmother.   Review of Systems: A full review of systems was performed and negative except as noted in the HPI.     O:  BP 113/79 (BP Location: Left Arm)   Pulse (!) 103   Temp 98.2 F (36.8 C) (Oral)   Resp 20   Ht 5\' 9"  (1.753 m)   Wt 97.5 kg   LMP 01/02/2019   BMI 31.75 kg/m  Results for orders placed or performed during the hospital encounter of 09/24/19 (from the past 48 hour(s))  Comprehensive metabolic panel   Collection Time: 09/24/19  5:06 PM  Result Value Ref Range   Sodium 135 135 - 145 mmol/L   Potassium 3.7 3.5 - 5.1 mmol/L   Chloride 107 98 - 111 mmol/L   CO2 19 (L) 22 -  32 mmol/L   Glucose, Bld 99 70 - 99 mg/dL   BUN 10 6 - 20 mg/dL   Creatinine, Ser 6.38 0.44 - 1.00 mg/dL   Calcium 8.6 (L) 8.9 - 10.3 mg/dL   Total Protein 6.0 (L) 6.5 - 8.1 g/dL   Albumin 2.7 (L) 3.5 - 5.0 g/dL   AST 18 15 - 41 U/L   ALT 16 0 - 44 U/L   Alkaline Phosphatase 97 38 - 126 U/L   Total Bilirubin 0.6 0.3 - 1.2 mg/dL   GFR calc non Af Amer >60 >60 mL/min   GFR calc Af Amer >60 >60 mL/min   Anion gap 9 5 - 15  Lipase, blood    Collection Time: 09/24/19  5:06 PM  Result Value Ref Range   Lipase 23 11 - 51 U/L  CBC   Collection Time: 09/24/19  5:06 PM  Result Value Ref Range   WBC 10.9 (H) 4.0 - 10.5 K/uL   RBC 3.79 (L) 3.87 - 5.11 MIL/uL   Hemoglobin 10.7 (L) 12.0 - 15.0 g/dL   HCT 75.6 (L) 43.3 - 29.5 %   MCV 87.9 80.0 - 100.0 fL   MCH 28.2 26.0 - 34.0 pg   MCHC 32.1 30.0 - 36.0 g/dL   RDW 18.8 41.6 - 60.6 %   Platelets 247 150 - 400 K/uL   nRBC 0.0 0.0 - 0.2 %  Amylase   Collection Time: 09/24/19  5:06 PM  Result Value Ref Range   Amylase 40 28 - 100 U/L  Urinalysis, Complete w Microscopic   Collection Time: 09/24/19  5:11 PM  Result Value Ref Range   Color, Urine YELLOW (A) YELLOW   APPearance CLEAR (A) CLEAR   Specific Gravity, Urine 1.020 1.005 - 1.030   pH 6.0 5.0 - 8.0   Glucose, UA NEGATIVE NEGATIVE mg/dL   Hgb urine dipstick NEGATIVE NEGATIVE   Bilirubin Urine NEGATIVE NEGATIVE   Ketones, ur NEGATIVE NEGATIVE mg/dL   Protein, ur NEGATIVE NEGATIVE mg/dL   Nitrite NEGATIVE NEGATIVE   Leukocytes,Ua NEGATIVE NEGATIVE   RBC / HPF 0-5 0 - 5 RBC/hpf   WBC, UA 0-5 0 - 5 WBC/hpf   Bacteria, UA NONE SEEN NONE SEEN   Squamous Epithelial / LPF NONE SEEN 0 - 5   Mucus PRESENT   Protein / creatinine ratio, urine   Collection Time: 09/24/19  5:11 PM  Result Value Ref Range   Creatinine, Urine 113 mg/dL   Total Protein, Urine 11 mg/dL   Protein Creatinine Ratio 0.10 0.00 - 0.15 mg/mg[Cre]     Constitutional: NAD, AAOx3  HE/ENT: extraocular movements grossly intact, moist mucous membranes CV: RRR PULM: normal respiratory effort, CTABL Back: symmetric, no CVAT    Abd: gravid, non-tender, non-distended, soft      Ext: Non-tender, Nonedmeatous   Psych: mood appropriate, speech normal Pelvic deferred  NST:  Baseline: 135 Variability: moderate Accelerations: 15x15 present x >2 Decelerations: absent Time: Toco: uterine irritability  A/P: 34 y.o. [redacted]w[redacted]d here for antenatal surveillance  during pregnancy.  Principle diagnosis: GI symptoms  Labor  Not present  Fetal Wellbeing  Reactive NST, reassuring for GA  GI symptoms  Amylase and lipase WNL  CMP without electrolyte abnormalities, liver abnormalities, or signs of preeclampia  Anemia  Hemoglobin 10.7  Advised addition of iron supplement to prenatal vitamin and plenty of iron rich foods  D/c home stable, precautions reviewed, follow-up as scheduled.    Genia Del 09/24/2019 5:50 PM  -----  Genia Del, CNM Certified Nurse Midwife [facility], Department of OB/GYN [facility]

## 2019-10-22 ENCOUNTER — Other Ambulatory Visit: Payer: Self-pay

## 2019-10-22 ENCOUNTER — Other Ambulatory Visit
Admission: RE | Admit: 2019-10-22 | Discharge: 2019-10-22 | Disposition: A | Discharge status: Home / Self Care | Payer: No Typology Code available for payment source | Source: Ambulatory Visit | Attending: Obstetrics and Gynecology | Admitting: Obstetrics and Gynecology

## 2019-10-22 DIAGNOSIS — Z01812 Encounter for preprocedural laboratory examination: Secondary | ICD-10-CM | POA: Insufficient documentation

## 2019-10-22 DIAGNOSIS — Z20822 Contact with and (suspected) exposure to covid-19: Secondary | ICD-10-CM | POA: Insufficient documentation

## 2019-10-23 ENCOUNTER — Other Ambulatory Visit: Payer: Self-pay | Admitting: Obstetrics and Gynecology

## 2019-10-23 LAB — SARS CORONAVIRUS 2 (TAT 6-24 HRS): SARS Coronavirus 2: NEGATIVE

## 2019-10-23 NOTE — Progress Notes (Signed)
Dating: EDD: 10/17/19  by LMP: 01/04/19 and c/w [place] at 7+0 wks.   Preg c/b: 1. Hypothyroidism  11/21/18 TSH 7.267 - not started on meds  02/07/19 TSH 9.637 - Started on 50 mcg synthroid  04/24/19 TSH 5.930- Synthroid increased to 100 mcg, Endo referral  05/22/2019 TSH 2.420  05/29/19 Endo visit - increased Levothyroxine to 112 mcg QD, will be seen 2. Anxiety disorder   Previously took Zoloft, does not currently want medications   06/27/19 Reports that she does not feel she needs medications, as the issue was situational  Labor Plans   Has doula for labor support   Birth plan sent to L&D  Would like low intervention birth - open to interventions as medically necessary   Prenatal Labs: Blood type/Rh  O Pos  Antibody screen neg  Rubella Immune  Varicella Immune  RPR NR  HBsAg Neg  HIV NR  GC neg  Chlamydia neg  Genetic screening negative  1 hour GTT 99  3 hour GTT   GBS neg

## 2019-10-24 ENCOUNTER — Inpatient Hospital Stay: Payer: No Typology Code available for payment source | Admitting: Anesthesiology

## 2019-10-24 ENCOUNTER — Inpatient Hospital Stay
Admission: AD | Admit: 2019-10-24 | Discharge: 2019-10-27 | DRG: 806 | Disposition: A | Discharge status: Home / Self Care | Payer: No Typology Code available for payment source | Attending: Obstetrics and Gynecology | Admitting: Obstetrics and Gynecology

## 2019-10-24 ENCOUNTER — Encounter: Payer: Self-pay | Admitting: Obstetrics and Gynecology

## 2019-10-24 ENCOUNTER — Other Ambulatory Visit: Payer: Self-pay

## 2019-10-24 DIAGNOSIS — O99284 Endocrine, nutritional and metabolic diseases complicating childbirth: Secondary | ICD-10-CM | POA: Diagnosis present

## 2019-10-24 DIAGNOSIS — R200 Anesthesia of skin: Secondary | ICD-10-CM | POA: Diagnosis not present

## 2019-10-24 DIAGNOSIS — D62 Acute posthemorrhagic anemia: Secondary | ICD-10-CM | POA: Diagnosis not present

## 2019-10-24 DIAGNOSIS — O9081 Anemia of the puerperium: Secondary | ICD-10-CM | POA: Diagnosis not present

## 2019-10-24 DIAGNOSIS — Z3A41 41 weeks gestation of pregnancy: Secondary | ICD-10-CM

## 2019-10-24 DIAGNOSIS — O99893 Other specified diseases and conditions complicating puerperium: Secondary | ICD-10-CM | POA: Diagnosis not present

## 2019-10-24 DIAGNOSIS — Z20822 Contact with and (suspected) exposure to covid-19: Secondary | ICD-10-CM | POA: Diagnosis present

## 2019-10-24 DIAGNOSIS — Z3480 Encounter for supervision of other normal pregnancy, unspecified trimester: Secondary | ICD-10-CM

## 2019-10-24 DIAGNOSIS — O480 Post-term pregnancy: Principal | ICD-10-CM | POA: Diagnosis present

## 2019-10-24 DIAGNOSIS — E039 Hypothyroidism, unspecified: Secondary | ICD-10-CM | POA: Diagnosis present

## 2019-10-24 LAB — COMPREHENSIVE METABOLIC PANEL
ALT: 19 U/L (ref 0–44)
AST: 21 U/L (ref 15–41)
Albumin: 3.2 g/dL — ABNORMAL LOW (ref 3.5–5.0)
Alkaline Phosphatase: 152 U/L — ABNORMAL HIGH (ref 38–126)
Anion gap: 9 (ref 5–15)
BUN: 9 mg/dL (ref 6–20)
CO2: 20 mmol/L — ABNORMAL LOW (ref 22–32)
Calcium: 9.1 mg/dL (ref 8.9–10.3)
Chloride: 106 mmol/L (ref 98–111)
Creatinine, Ser: 0.77 mg/dL (ref 0.44–1.00)
GFR calc Af Amer: >60 mL/min (ref >60)
GFR calc non Af Amer: >60 mL/min (ref >60)
Glucose, Bld: 72 mg/dL (ref 70–99)
Potassium: 4.1 mmol/L (ref 3.5–5.1)
Sodium: 135 mmol/L (ref 135–145)
Total Bilirubin: 0.5 mg/dL (ref 0.3–1.2)
Total Protein: 6.6 g/dL (ref 6.5–8.1)

## 2019-10-24 LAB — RPR: RPR Ser Ql: NONREACTIVE

## 2019-10-24 LAB — CBC
HCT: 34.8 % — ABNORMAL LOW (ref 36.0–46.0)
Hemoglobin: 11.1 g/dL — ABNORMAL LOW (ref 12.0–15.0)
MCH: 28.2 pg (ref 26.0–34.0)
MCHC: 31.9 g/dL (ref 30.0–36.0)
MCV: 88.3 fL (ref 80.0–100.0)
Platelets: 294 10*3/uL (ref 150–400)
RBC: 3.94 MIL/uL (ref 3.87–5.11)
RDW: 15.7 % — ABNORMAL HIGH (ref 11.5–15.5)
WBC: 11.9 10*3/uL — ABNORMAL HIGH (ref 4.0–10.5)
nRBC: 0 % (ref 0.0–0.2)

## 2019-10-24 LAB — PROTEIN / CREATININE RATIO, URINE
Creatinine, Urine: 40 mg/dL
Total Protein, Urine: <6 mg/dL

## 2019-10-24 LAB — ABO/RH: ABO/RH(D): O POS

## 2019-10-24 MED ORDER — MISOPROSTOL 25 MCG QUARTER TABLET
25.0000 ug | ORAL_TABLET | ORAL | Status: DC | PRN
  Administered 2019-10-24 (×2): 25 ug via VAGINAL
  Filled 2019-10-24 (×2): qty 1

## 2019-10-24 MED ORDER — LACTATED RINGERS IV SOLN: INTRAVENOUS | Status: DC

## 2019-10-24 MED ORDER — SOD CITRATE-CITRIC ACID 500-334 MG/5ML PO SOLN: 30.0000 mL | ORAL | Status: DC | PRN

## 2019-10-24 MED ORDER — AMMONIA AROMATIC IN INHA
RESPIRATORY_TRACT | Status: AC
  Filled 2019-10-24: qty 10

## 2019-10-24 MED ORDER — OXYTOCIN-SODIUM CHLORIDE 30-0.9 UT/500ML-% IV SOLN
1.0000 m[IU]/min | INTRAVENOUS | Status: DC
  Administered 2019-10-24: 1 m[IU]/min via INTRAVENOUS

## 2019-10-24 MED ORDER — DIPHENHYDRAMINE HCL 50 MG/ML IJ SOLN: 12.5000 mg | INTRAMUSCULAR | Status: DC | PRN

## 2019-10-24 MED ORDER — LACTATED RINGERS IV SOLN
500.0000 mL | Freq: Once | INTRAVENOUS | Status: AC
  Administered 2019-10-24: 500 mL via INTRAVENOUS

## 2019-10-24 MED ORDER — FENTANYL 2.5 MCG/ML W/ROPIVACAINE 0.15% IN NS 100 ML EPIDURAL (ARMC)
EPIDURAL | Status: AC
  Filled 2019-10-24: qty 100

## 2019-10-24 MED ORDER — ACETAMINOPHEN 325 MG PO TABS
650.0000 mg | ORAL_TABLET | ORAL | Status: DC | PRN
  Administered 2019-10-24: 650 mg via ORAL
  Filled 2019-10-24: qty 2

## 2019-10-24 MED ORDER — TERBUTALINE SULFATE 1 MG/ML IJ SOLN: 0.2500 mg | Freq: Once | INTRAMUSCULAR | Status: DC | PRN

## 2019-10-24 MED ORDER — LIDOCAINE-EPINEPHRINE (PF) 1.5 %-1:200000 IJ SOLN
INTRAMUSCULAR | Status: DC | PRN
  Administered 2019-10-24: 3 mL via PERINEURAL
  Administered 2019-10-24: 2 mL via PERINEURAL

## 2019-10-24 MED ORDER — ONDANSETRON HCL 4 MG/2ML IJ SOLN
4.0000 mg | Freq: Four times a day (QID) | INTRAMUSCULAR | Status: DC | PRN
  Administered 2019-10-24 – 2019-10-25 (×3): 4 mg via INTRAVENOUS
  Filled 2019-10-24 (×3): qty 2

## 2019-10-24 MED ORDER — OXYTOCIN 10 UNIT/ML IJ SOLN
INTRAMUSCULAR | Status: AC
  Filled 2019-10-24: qty 2

## 2019-10-24 MED ORDER — DIPHENOXYLATE-ATROPINE 2.5-0.025 MG PO TABS
ORAL_TABLET | ORAL | Status: AC
  Filled 2019-10-24: qty 2

## 2019-10-24 MED ORDER — MISOPROSTOL 200 MCG PO TABS
ORAL_TABLET | ORAL | Status: AC
  Filled 2019-10-24: qty 4

## 2019-10-24 MED ORDER — FENTANYL 2.5 MCG/ML W/ROPIVACAINE 0.15% IN NS 100 ML EPIDURAL (ARMC)
12.0000 mL/h | EPIDURAL | Status: DC
  Administered 2019-10-24 – 2019-10-25 (×3): 12 mL/h via EPIDURAL
  Filled 2019-10-24 (×2): qty 100

## 2019-10-24 MED ORDER — LACTATED RINGERS IV SOLN: 500.0000 mL | INTRAVENOUS | Status: DC | PRN

## 2019-10-24 MED ORDER — BUPIVACAINE HCL (PF) 0.25 % IJ SOLN
INTRAMUSCULAR | Status: DC | PRN
  Administered 2019-10-24: 2 mL via EPIDURAL
  Administered 2019-10-24: 4 mL via EPIDURAL

## 2019-10-24 MED ORDER — LIDOCAINE HCL (PF) 1 % IJ SOLN
INTRAMUSCULAR | Status: DC | PRN
  Administered 2019-10-24: 3 mL

## 2019-10-24 MED ORDER — PHENYLEPHRINE 40 MCG/ML (10ML) SYRINGE FOR IV PUSH (FOR BLOOD PRESSURE SUPPORT): 80.0000 ug | PREFILLED_SYRINGE | INTRAVENOUS | Status: DC | PRN

## 2019-10-24 MED ORDER — LIDOCAINE HCL (PF) 1 % IJ SOLN
30.0000 mL | INTRAMUSCULAR | Status: AC | PRN
  Administered 2019-10-25: 30 mL via SUBCUTANEOUS

## 2019-10-24 MED ORDER — OXYTOCIN-SODIUM CHLORIDE 30-0.9 UT/500ML-% IV SOLN
2.5000 [IU]/h | INTRAVENOUS | Status: DC
  Administered 2019-10-25: 2.5 [IU]/h via INTRAVENOUS
  Filled 2019-10-24: qty 1000

## 2019-10-24 MED ORDER — OXYTOCIN BOLUS FROM INFUSION
333.0000 mL | Freq: Once | INTRAVENOUS | Status: AC
  Administered 2019-10-25: 333 mL via INTRAVENOUS

## 2019-10-24 MED ORDER — LIDOCAINE HCL (PF) 1 % IJ SOLN
INTRAMUSCULAR | Status: AC
  Filled 2019-10-24: qty 30

## 2019-10-24 MED ORDER — MISOPROSTOL 25 MCG QUARTER TABLET
25.0000 ug | ORAL_TABLET | ORAL | Status: DC | PRN
  Administered 2019-10-24 (×2): 25 ug via BUCCAL
  Filled 2019-10-24 (×2): qty 1

## 2019-10-24 MED ORDER — EPHEDRINE 5 MG/ML INJ: 10.0000 mg | INTRAVENOUS | Status: DC | PRN

## 2019-10-24 MED ORDER — BUTORPHANOL TARTRATE 1 MG/ML IJ SOLN: 1.0000 mg | INTRAMUSCULAR | Status: DC | PRN

## 2019-10-24 NOTE — H&P (Signed)
OB History & Physical   History of Present Illness:  Chief Complaint: late term IOL  HPI:  Katherine Graham is a 34 y.o. G20P0010 female at [redacted]w[redacted]d dated by LMP and c/w [place] at [redacted]w[redacted]d.  She presents to L&D for induction of labor.  Reports active FM, denies LOF or VB. Currently feeling ctx in her back and low abdomen, approx q2 minutes. S/p 2 doses of Cytotec, last one given at 0741.  Pregnancy Issues: 1. Hypothyroidism  11/21/18 TSH 7.267 - not started on meds  02/07/19 TSH 9.637 - Started on 50 mcg synthroid  04/24/19 TSH 5.930- Synthroid increased to 100 mcg, Endo referral  05/22/2019 TSH 2.420  05/29/19 Endo visit - increased Levothyroxine to 112 mcg QD, will be seen 2. Anxiety disorder   Previously took Zoloft, does not currently want medications   06/27/19 Reports that she does not feel she needs medications, as the issue was situational 3. Labor Plans   Has doula for labor support   Birth plan sent to L&D  Would like low intervention birth - open to interventions as medically necessary    Maternal Medical History:   Past Medical History:  Diagnosis Date  . Hypothyroidism     Past Surgical History:  Procedure Laterality Date  . INDUCED ABORTION     Age 19  . TONSILLECTOMY      No Known Allergies  Prior to Admission medications   Medication Sig Start Date End Date Taking? Authorizing Provider  levothyroxine (SYNTHROID) 50 MCG tablet Take 50 mcg by mouth daily before breakfast. 02/20/19 02/20/20 Yes [provider]  loratadine (CLARITIN) 10 MG tablet Take 10 mg by mouth daily.   Yes [provider]  magnesium oxide (MAG-OX) 400 MG tablet Take 400 mg by mouth daily.   Yes [provider]  ondansetron (ZOFRAN ODT) 4 MG disintegrating tablet Take 1 tablet (4 mg total) by mouth every 8 (eight) hours as needed for nausea or vomiting. 09/24/19  Yes Genia Del, CNM  Prenatal Vit-Fe Fumarate-FA (PRENATAL VITAMIN AND MINERAL PO)  08/22/18  Yes  [provider]  Probiotic Product (PROBIOTIC-10 ULTIMATE) CAPS Take 1 capsule by mouth daily.   Yes [provider]  vitamin B-12 (CYANOCOBALAMIN) 500 MCG tablet Take 500 mcg by mouth daily.   Yes [provider]  clonazePAM (KLONOPIN) 0.5 MG tablet  01/18/18   [provider]  Doxylamine-Pyridoxine (DICLEGIS) 10-10 MG TBEC Take 2 tablets by mouth at bedtime. If symptoms persist, add one tablet in the morning and one in the afternoon Patient not taking: Reported on 09/14/2019 02/19/19   Vena Austria, MD  hydrOXYzine (ATARAX/VISTARIL) 25 MG tablet Take 1 tablet (25 mg total) by mouth at bedtime. Patient not taking: Reported on 09/14/2019 03/26/19   Oswaldo Conroy, CNM  sertraline (ZOLOFT) 50 MG tablet Take by mouth. Patient not taking: Reported on 10/24/2019 12/04/18 12/04/19  [provider]  topiramate (TOPAMAX) 50 MG tablet  08/29/18   [provider]     Prenatal care site: [facility]  Social History: She  reports that she has never smoked. She has never used smokeless tobacco. She reports previous alcohol use. She reports that she does not use drugs.  Family History: family history includes Colon cancer (age of onset: 86) in her maternal grandmother.   Review of Systems: A full review of systems was performed and negative except as noted in the HPI.     Physical Exam:  Vital Signs: BP 130/74 (  BP Location: Right Arm)   Pulse 85   Temp 97.7 F (36.5 C) (Oral)   Resp 18   Ht 5\' 9"  (1.753 m)   Wt 102.1 kg   LMP 01/02/2019   BMI 33.23 kg/m  General: no acute distress.  HEENT: normocephalic, atraumatic Heart: regular rate & rhythm.  No murmurs/rubs/gallops Lungs: clear to auscultation bilaterally, normal respiratory effort Abdomen: soft, gravid, non-tender;  EFW: 8.5lbs Pelvic:   External: Normal external female genitalia  Cervix: Dilation: 2.5 / Effacement (%): 80 / Station: -1    Extremities: non-tender,  symmetric, no edema bilaterally.  DTRs: 2+  Neurologic: Alert & oriented x 3.    Results for orders placed or performed during the hospital encounter of 10/24/19 (from the past 24 hour(s))  CBC     Status: Abnormal   Collection Time: 10/24/19  1:10 AM  Result Value Ref Range   WBC 11.9 (H) 4.0 - 10.5 K/uL   RBC 3.94 3.87 - 5.11 MIL/uL   Hemoglobin 11.1 (L) 12.0 - 15.0 g/dL   HCT 10/26/19 (L) 36 - 46 %   MCV 88.3 80.0 - 100.0 fL   MCH 28.2 26.0 - 34.0 pg   MCHC 31.9 30.0 - 36.0 g/dL   RDW 29.9 (H) 24.2 - 68.3 %   Platelets 294 150 - 400 K/uL   nRBC 0.0 0.0 - 0.2 %  RPR     Status: None   Collection Time: 10/24/19  1:10 AM  Result Value Ref Range   RPR Ser Ql NON REACTIVE NON REACTIVE  Type and screen     Status: None   Collection Time: 10/24/19  1:10 AM  Result Value Ref Range   ABO/RH(D) O POS    Antibody Screen NEG    Sample Expiration      10/27/2019,2359 Performed at [facility], [address] [place], [place]   ABO/Rh     Status: None   Collection Time: 10/24/19  3:20 AM  Result Value Ref Range   ABO/RH(D)      O POS Performed at [facility], [address] [place], [place]     Pertinent Results:  Prenatal Labs: Blood type/Rh  O Pos  Antibody screen neg  Rubella Immune  Varicella Immune  RPR NR  HBsAg Neg  HIV NR  GC neg  Chlamydia neg  Genetic screening negative  1 hour GTT  99  GBS  negative   FHT: 140bpm, mod variability, + accels, no decels TOCO: q2-31min, palp moderate SVE:  Dilation: 2.5 / Effacement (%): 80 / Station: -1    Cephalic by leopolds and SVE  No results found.  Assessment:  Katherine Graham is a 34 y.o. G2P0010 female at [redacted]w[redacted]d with late term IOL.   Plan:  1. Admit to Labor & Delivery; consents reviewed and obtained - COVID neg on admit - birth plan reviewed  2. Fetal Well being  - Fetal Tracing: Cat I - Group B Streptococcus ppx indicated: negative - Presentation: cephalic confirmed by  exam, [redacted]w[redacted]d   3. Routine OB: - Prenatal labs reviewed, as above - Rh O pos - CBC, T&S, RPR on admit - Clear fluids, IVF  4. Induction of Labor -  Contractions: external toco in place -  Pelvis adequate for TOL -  Plan for induction with cytotec, pitocin.  -  Plan for continuous fetal monitoring  -  Maternal pain control as desired - Anticipate vaginal delivery  5. Post Partum  Planning: - Infant feeding: breast - Contraception: TBD  Francetta Found, CNM 10/24/19 12:56 PM

## 2019-10-24 NOTE — Anesthesia Preprocedure Evaluation (Signed)
Anesthesia Evaluation  Patient identified by MRN, date of birth, ID band Patient awake    Reviewed: Allergy & Precautions, H&P , NPO status , Patient's Chart, lab work & pertinent test results  Airway Mallampati: II   Neck ROM: full    Dental no notable dental hx.    Pulmonary neg pulmonary ROS,           Cardiovascular negative cardio ROS       Neuro/Psych  Headaches, Anxiety    GI/Hepatic Neg liver ROS, GERD  ,  Endo/Other  Hypothyroidism   Renal/GU negative Renal ROS     Musculoskeletal   Abdominal   Peds  Hematology negative hematology ROS (+)   Anesthesia Other Findings   Reproductive/Obstetrics (+) Pregnancy                             Anesthesia Physical Anesthesia Plan  ASA: II  Anesthesia Plan: Epidural   Post-op Pain Management:    Induction:   PONV Risk Score and Plan:   Airway Management Planned:   Additional Equipment:   Intra-op Plan:   Post-operative Plan:   Informed Consent:   Plan Discussed with: Anesthesiologist and CRNA  Anesthesia Plan Comments:         Anesthesia Quick Evaluation

## 2019-10-24 NOTE — Anesthesia Procedure Notes (Signed)
Epidural Patient location during procedure: OB Start time: 10/24/2019 5:37 PM End time: 10/24/2019 5:49 PM  Staffing Anesthesiologist: Corinda Gubler, MD Resident/CRNA: Elmarie Mainland, CRNA Performed: resident/CRNA   Preanesthetic Checklist Completed: patient identified, IV checked, site marked, risks and benefits discussed, surgical consent, monitors and equipment checked, pre-op evaluation and timeout performed  Epidural Patient position: sitting Prep: ChloraPrep Patient monitoring: heart rate, continuous pulse ox and blood pressure Approach: midline Location: L3-L4 Injection technique: LOR saline  Needle:  Needle type: Tuohy  Needle gauge: 17 G Needle length: 9 cm and 9 Needle insertion depth: 6.5 cm Catheter type: closed end flexible Catheter size: 19 Gauge Catheter at skin depth: 11 cm Test dose: negative and 1.5% lidocaine with Epi 1:200 K  Assessment Sensory level: T10 Events: blood not aspirated, injection not painful, no injection resistance, no paresthesia and negative IV test  Additional Notes 1 attempt Pt. Evaluated and documentation done after procedure finished. Patient identified. Risks/Benefits/Options discussed with patient including but not limited to bleeding, infection, nerve damage, paralysis, failed block, incomplete pain control, headache, blood pressure changes, nausea, vomiting, reactions to medication both or allergic, itching and postpartum back pain. Confirmed with bedside nurse the patient's most recent platelet count. Confirmed with patient that they are not currently taking any anticoagulation, have any bleeding history or any family history of bleeding disorders. Patient expressed understanding and wished to proceed. All questions were answered. Sterile technique was used throughout the entire procedure. Please see nursing notes for vital signs. Test dose was given through epidural catheter and negative prior to continuing to dose epidural or start  infusion. Warning signs of high block given to the patient including shortness of breath, tingling/numbness in hands, complete motor block, or any concerning symptoms with instructions to call for help. Patient was given instructions on fall risk and not to get out of bed. All questions and concerns addressed with instructions to call with any issues or inadequate analgesia.   Patient tolerated the insertion well without immediate complications.Reason for block:procedure for pain

## 2019-10-24 NOTE — Progress Notes (Signed)
Labor Progress Note  Katherine Graham is a 34 y.o. G2P0010 at [redacted]w[redacted]d by LMP admitted for induction of labor due to Post dates. Due date 10/17/19  Subjective: comfortable with epidural, feeling nothing.   Objective: BP 104/60    Pulse 86    Temp 97.8 F (36.6 C) (Oral)    Resp 18    Ht 5\' 9"  (1.753 m)    Wt 102.1 kg    LMP 01/02/2019    SpO2 99%    BMI 33.23 kg/m  Notable VS details: normotensive since epidural, 2 mild range BPs prior to epidural placement.   Fetal Assessment: FHT:  FHR: 130 bpm, variability: moderate,  accelerations:  Present,  decelerations:  Absent Category/reactivity:  Category I UC:   regular, every 1-3 minutes, Pitocin at 38mu/min SVE:   8/90/0, SROM noted, clear fluid on pads with bloody show.   Membrane status: SROM at 2115 Amniotic color: clear  Labs: Lab Results  Component Value Date   WBC 11.9 (H) 10/24/2019   HGB 11.1 (L) 10/24/2019   HCT 34.8 (L) 10/24/2019   MCV 88.3 10/24/2019   PLT 294 10/24/2019    Assessment / Plan: Induction of labor due to postterm,  progressing well on pitocin  Labor: s/p cytotec x 2, progressing on Pitocin and s/p SROM.  Preeclampsia:  no e/o Pre-e Fetal Wellbeing:  Category I Pain Control:  Epidural I/D:  n/a Anticipated MOD:  NSVD  10/26/2019, CNM 10/24/2019, 9:21 PM

## 2019-10-25 ENCOUNTER — Encounter: Payer: Self-pay | Admitting: Obstetrics and Gynecology

## 2019-10-25 LAB — CBC
HCT: 28.9 % — ABNORMAL LOW (ref 36.0–46.0)
Hemoglobin: 9.6 g/dL — ABNORMAL LOW (ref 12.0–15.0)
MCH: 28.4 pg (ref 26.0–34.0)
MCHC: 33.2 g/dL (ref 30.0–36.0)
MCV: 85.5 fL (ref 80.0–100.0)
Platelets: 250 10*3/uL (ref 150–400)
RBC: 3.38 MIL/uL — ABNORMAL LOW (ref 3.87–5.11)
RDW: 15.9 % — ABNORMAL HIGH (ref 11.5–15.5)
WBC: 24.3 10*3/uL — ABNORMAL HIGH (ref 4.0–10.5)
nRBC: 0 % (ref 0.0–0.2)

## 2019-10-25 MED ORDER — WITCH HAZEL-GLYCERIN EX PADS
1.0000 "application " | MEDICATED_PAD | CUTANEOUS | Status: DC | PRN
  Filled 2019-10-25 (×3): qty 100

## 2019-10-25 MED ORDER — IBUPROFEN 600 MG PO TABS
600.0000 mg | ORAL_TABLET | Freq: Four times a day (QID) | ORAL | Status: DC
  Administered 2019-10-25 – 2019-10-27 (×9): 600 mg via ORAL
  Filled 2019-10-25 (×9): qty 1

## 2019-10-25 MED ORDER — SENNOSIDES-DOCUSATE SODIUM 8.6-50 MG PO TABS
2.0000 | ORAL_TABLET | ORAL | Status: DC
  Filled 2019-10-25: qty 2

## 2019-10-25 MED ORDER — METHYLERGONOVINE MALEATE 0.2 MG/ML IJ SOLN
INTRAMUSCULAR | Status: AC
  Administered 2019-10-25: 0.2 mg
  Filled 2019-10-25: qty 1

## 2019-10-25 MED ORDER — ACETAMINOPHEN 325 MG PO TABS
650.0000 mg | ORAL_TABLET | ORAL | Status: DC | PRN
  Administered 2019-10-25: 650 mg via ORAL
  Filled 2019-10-25: qty 2

## 2019-10-25 MED ORDER — COCONUT OIL OIL
1.0000 "application " | TOPICAL_OIL | Status: DC | PRN
  Filled 2019-10-25: qty 120

## 2019-10-25 MED ORDER — ONDANSETRON HCL 4 MG/2ML IJ SOLN: 4.0000 mg | INTRAMUSCULAR | Status: DC | PRN

## 2019-10-25 MED ORDER — ZOLPIDEM TARTRATE 5 MG PO TABS: 5.0000 mg | ORAL_TABLET | Freq: Every evening | ORAL | Status: DC | PRN

## 2019-10-25 MED ORDER — MISOPROSTOL 200 MCG PO TABS
800.0000 ug | ORAL_TABLET | Freq: Once | ORAL | Status: AC
  Administered 2019-10-25 (×2): 800 ug via RECTAL

## 2019-10-25 MED ORDER — SIMETHICONE 80 MG PO CHEW: 80.0000 mg | CHEWABLE_TABLET | ORAL | Status: DC | PRN

## 2019-10-25 MED ORDER — PRENATAL MULTIVITAMIN CH: 1.0000 | ORAL_TABLET | Freq: Every day | ORAL | Status: DC

## 2019-10-25 MED ORDER — ONDANSETRON HCL 4 MG PO TABS: 4.0000 mg | ORAL_TABLET | ORAL | Status: DC | PRN

## 2019-10-25 MED ORDER — OXYCODONE HCL 5 MG PO TABS: 5.0000 mg | ORAL_TABLET | ORAL | Status: DC | PRN

## 2019-10-25 MED ORDER — FERROUS SULFATE 325 (65 FE) MG PO TABS
325.0000 mg | ORAL_TABLET | Freq: Two times a day (BID) | ORAL | Status: DC
  Administered 2019-10-25 – 2019-10-27 (×5): 325 mg via ORAL
  Filled 2019-10-25 (×5): qty 1

## 2019-10-25 MED ORDER — DIPHENOXYLATE-ATROPINE 2.5-0.025 MG PO TABS
2.0000 | ORAL_TABLET | Freq: Four times a day (QID) | ORAL | Status: AC
  Administered 2019-10-25 (×2): 2 via ORAL
  Filled 2019-10-25 (×2): qty 2

## 2019-10-25 MED ORDER — LEVOTHYROXINE SODIUM 50 MCG PO TABS
50.0000 ug | ORAL_TABLET | Freq: Every day | ORAL | Status: DC
  Filled 2019-10-25: qty 1

## 2019-10-25 MED ORDER — CARBOPROST TROMETHAMINE 250 MCG/ML IM SOLN
INTRAMUSCULAR | Status: AC
  Administered 2019-10-25: 250 ug
  Filled 2019-10-25: qty 1

## 2019-10-25 MED ORDER — DIBUCAINE (PERIANAL) 1 % EX OINT
1.0000 "application " | TOPICAL_OINTMENT | CUTANEOUS | Status: DC | PRN
  Filled 2019-10-25 (×3): qty 28

## 2019-10-25 MED ORDER — TRANEXAMIC ACID-NACL 1000-0.7 MG/100ML-% IV SOLN
1000.0000 mg | Freq: Once | INTRAVENOUS | Status: AC
  Administered 2019-10-25: 1000 mg via INTRAVENOUS
  Filled 2019-10-25: qty 100

## 2019-10-25 MED ORDER — LEVOTHYROXINE SODIUM 112 MCG PO TABS
112.0000 ug | ORAL_TABLET | Freq: Every day | ORAL | Status: DC
  Administered 2019-10-25 – 2019-10-27 (×3): 112 ug via ORAL
  Filled 2019-10-25 (×5): qty 1

## 2019-10-25 MED ORDER — BENZOCAINE-MENTHOL 20-0.5 % EX AERO
1.0000 "application " | INHALATION_SPRAY | CUTANEOUS | Status: DC | PRN
  Filled 2019-10-25 (×2): qty 56

## 2019-10-25 MED ORDER — DIPHENHYDRAMINE HCL 25 MG PO CAPS: 25.0000 mg | ORAL_CAPSULE | Freq: Four times a day (QID) | ORAL | Status: DC | PRN

## 2019-10-25 NOTE — Discharge Summary (Signed)
Obstetrical Discharge Summary  Patient Name: Katherine Graham DOB: 09/29/1985 MRN: [id number]  Date of Admission: 10/24/2019 Date of Delivery:  10/25/19 Delivered by: Heloise Ochoa CNM Date of Discharge: 10/28/2019  Primary OB: [facility]  BOF:[id number] last menstrual period was 01/02/2019. EDC Estimated Date of Delivery: 10/17/19 Gestational Age at Delivery: [redacted]w[redacted]d   Antepartum complications:  1. Hypothyroidism  11/21/18 TSH 7.267 - not started on meds  02/07/19 TSH 9.637 - Started on 50 mcg synthroid  04/24/19 TSH 5.930- Synthroid increased to 100 mcg, Endo referral  05/22/2019 TSH 2.420  05/29/19 Endo visit - increased Levothyroxine to 112 mcg QD, will be seen 2. Anxiety disorder   Previously took Zoloft, does not currently want medications   06/27/19 Reports that she does not feel she needs medications, as the issue was situational 3. Labor Plans   Has doula for labor support   Birth plan sent to L&D  Would like low intervention birth - open to interventions as medically necessary   Admitting Diagnosis: late term IOL, 41wks  Secondary Diagnosis: SVD, postpartum hemorrhage, 2nd deg perineal lac  Patient Active Problem List   Diagnosis Date Noted  . Post-term pregnancy, 40-42 weeks of gestation 10/24/2019  . Nausea/vomiting in pregnancy 09/24/2019  . Pregnancy 09/24/2019  . Decreased fetal movement 06/01/2019  . Supervision of other normal pregnancy, antepartum 03/01/2019  . GAD (generalized anxiety disorder) 12/04/2018  . Cervical radiculopathy 07/13/2016  . GERD without esophagitis 07/09/2015  . Migraine without aura and without status migrainosus, not intractable 07/09/2015    Augmentation: Pitocin and Cytotec Complications: Hemorrhage>10108mL Intrapartum complications/course: prolonged 2nd stage, PP hemorrhage, see delivery note.  Date of Delivery: 10/25/19 Delivered By: Heloise Ochoa CNM Delivery Type: spontaneous vaginal delivery Anesthesia:  epidural Placenta: spontaneous Laceration: 2nd deg perineal Episiotomy: none Newborn Data: Live born female "Fraser Din"  Birth Weight:  4140g  9lb 2oz APGAR: 8, 9  Newborn Delivery   Birth date/time: 10/25/2019 06:58:00 Delivery type: Vaginal, Spontaneous      Postpartum Procedures: blood transfusion  Edinburgh:  Edinburgh Postnatal Depression Scale Screening Tool 10/27/2019 10/25/2019 10/25/2019  I have been able to laugh and see the funny side of things. 0 0 (No Data)  I have looked forward with enjoyment to things. 0 0 -  I have blamed myself unnecessarily when things went wrong. 2 2 -  I have been anxious or worried for no good reason. 1 2 -  I have felt scared or panicky for no good reason. 0 1 -  Things have been getting on top of me. 0 2 -  I have been so unhappy that I have had difficulty sleeping. 0 0 -  I have felt sad or miserable. 0 1 -  I have been so unhappy that I have been crying. 0 0 -  The thought of harming myself has occurred to me. 0 0 -  Edinburgh Postnatal Depression Scale Total 3 8 -      Post partum course:  Patient had an uncomplicated postpartum course.  By time of discharge on PPD#2, her pain was controlled on oral pain medications; she had appropriate lochia and was ambulating, voiding without difficulty and tolerating regular diet.  She was deemed stable for discharge to home.     Discharge Physical Exam:  BP 122/80 (BP Location: Left Arm)   Pulse 70   Temp 98 F (36.7 C)   Resp 18   Ht 5\' 9"  (1.753 m)   Wt 102.1 kg   LMP  01/02/2019   SpO2 99%   Breastfeeding Unknown   BMI 33.23 kg/m   General: NAD CV: RRR Pulm: nl effort ABD: s/nd/nt, fundus firm and below the umbilicus Lochia: moderate Perineum: well approximated DVT Evaluation: LE non-ttp, no evidence of DVT on exam.  Hemoglobin  Date Value Ref Range Status  10/26/2019 8.2 (L) 12.0 - 15.0 g/dL Final  02/28/2019 13.2 11.1 - 15.9 g/dL Final   HCT  Date Value Ref Range Status   10/26/2019 25.0 (L) 36 - 46 % Final    Comment:    Performed at [facility], [address] [place], [place] [address]   Hematocrit  Date Value Ref Range Status  02/28/2019 38.3 34.0 - 46.6 % Final     Disposition: stable, discharge to home. Baby Feeding: breastmilk Baby Disposition: home with mom  Rh Immune globulin given: n/a Rubella vaccine given: immune Varicella vaccine given: immune Tdap vaccine given in AP or PP setting: declined Flu vaccine given in AP or PP setting: declined  Contraception: TBD  Prenatal Labs:  Blood type/Rh  O Pos  Antibody screen neg  Rubella Immune  Varicella Immune  RPR NR  HBsAg Neg  HIV NR  GC neg  Chlamydia neg  Genetic screening negative  1 hour GTT  99  GBS  negative      Plan:  Katherine Graham was discharged to home in good condition. Follow-up appointment with delivering provider in 6 weeks.  Discharge Medications: Allergies as of 10/27/2019   No Known Allergies     Medication List    STOP taking these medications   clonazePAM 0.5 MG tablet Commonly known as: KLONOPIN   Doxylamine-Pyridoxine 10-10 MG Tbec Commonly known as: Diclegis   hydrOXYzine 25 MG tablet Commonly known as: ATARAX/VISTARIL   ondansetron 4 MG disintegrating tablet Commonly known as: Zofran ODT   sertraline 50 MG tablet Commonly known as: ZOLOFT   topiramate 50 MG tablet Commonly known as: TOPAMAX     TAKE these medications   acetaminophen 325 MG tablet Commonly known as: Tylenol Take 2 tablets (650 mg total) by mouth every 6 (six) hours as needed (for pain scale < 4).   docusate sodium 100 MG capsule Commonly known as: Colace Take 1 capsule (100 mg total) by mouth daily.   ferrous sulfate 325 (65 FE) MG tablet Take 1 tablet (325 mg total) by mouth 2 (two) times daily with a meal.   gabapentin 100 MG capsule Commonly known as: NEURONTIN Take 1 capsule (100 mg total) by mouth 3 (three) times daily for 14 days.    ibuprofen 600 MG tablet Commonly known as: ADVIL Take 1 tablet (600 mg total) by mouth every 6 (six) hours as needed.   levothyroxine 112 MCG tablet Commonly known as: SYNTHROID Take 1 tablet (112 mcg total) by mouth daily at 6 (six) AM. What changed:   medication strength  how much to take  when to take this   loratadine 10 MG tablet Commonly known as: CLARITIN Take 10 mg by mouth daily.   magnesium oxide 400 MG tablet Commonly known as: MAG-OX Take 400 mg by mouth daily.   PRENATAL VITAMIN AND MINERAL PO   Probiotic-10 Ultimate Caps Take 1 capsule by mouth daily.   vitamin B-12 500 MCG tablet Commonly known as: CYANOCOBALAMIN Take 500 mcg by mouth daily.        Follow-up Information    McVey, Murray Hodgkins, CNM. Schedule an appointment as soon as possible for a  visit in 2 week(s).   Specialty: Obstetrics and Gynecology Why: for a mood check Contact information: [address] [place] [address] [phone]               Signed:  Cyril Mourning, CNM 10/28/2019  9:10 PM

## 2019-10-25 NOTE — Progress Notes (Signed)
Labor Progress Note  Katherine Graham is a 34 y.o. G2P0010 at [redacted]w[redacted]d by LMP admitted for induction of labor due to Post dates. Due date 10/17/19  Subjective: comfortable with epidural, feeling pressure with UCs, has been pushing about 1 hr.   Objective: BP 139/76   Pulse 96   Temp 98.6 F (37 C) (Oral)   Resp 18   Ht 5\' 9"  (1.753 m)   Wt 102.1 kg   LMP 01/02/2019   SpO2 100%   BMI 33.23 kg/m  Notable VS details: normotensive since epidural, 2 mild range BPs prior to epidural placement.   Fetal Assessment: FHT:  FHR: 145 bpm, variability: moderate,  accelerations:  Present,  decelerations:  Present variables Category/reactivity:  Category II UC:   regular, every 2-3 minutes, Pitocin at 66mu/min SVE:   C/C/+2  Membrane status: SROM at 2115 Amniotic color: clear  Labs: Lab Results  Component Value Date   WBC 11.9 (H) 10/24/2019   HGB 11.1 (L) 10/24/2019   HCT 34.8 (L) 10/24/2019   MCV 88.3 10/24/2019   PLT 294 10/24/2019    Assessment / Plan: IOL at 41+1wks, 2nd stage labor  Labor: s/p cytotec x 2, progressing on Pitocin and s/p SROM. 2nd stage labor, now pushing.  Preeclampsia:  no e/o Pre-e Fetal Wellbeing:  Category I Pain Control:  Epidural I/D:  n/a Anticipated MOD:  NSVD  10/26/2019 Haydyn Liddell, CNM 10/25/2019, 3:49 AM

## 2019-10-25 NOTE — Progress Notes (Signed)
MD Noralyn Pick anesthesiology and CNM Lurena Joiner notified of patients continued numbness/pins and needles to outside of Left leg and foot with pain in her foot when pressure is applied. No new orders given. Will continue with plan of care.

## 2019-10-25 NOTE — Progress Notes (Signed)
Labor Progress Note  Katherine Graham is a 34 y.o. G2P0010 at [redacted]w[redacted]d by LMP admitted for induction of labor due to Post dates. Due date 10/17/19  Subjective: feeling pressure with UCs, has been pushing about 2hrs and rested about 40 min.   Objective: BP 139/76   Pulse 96   Temp 98.6 F (37 C) (Oral)   Resp 18   Ht 5\' 9"  (1.753 m)   Wt 102.1 kg   LMP 01/02/2019   SpO2 97%   BMI 33.23 kg/m  Notable VS details: normotensive since epidural, 2 mild range BPs prior to epidural placement.   Fetal Assessment: FHT:  FHR: 145 bpm, variability: moderate,  accelerations:  Present,  decelerations:  Present variables Category/reactivity:  Category II UC:   regular, every 2-3 minutes, Pitocin at 4mu/min SVE:   C/C/+1 with caput at +2 station. good maternal effort with pushing but no fetal descent since last exam by CNM at 8-9cm.     Membrane status: SROM at 2115 Amniotic color: clear  Labs: Lab Results  Component Value Date   WBC 11.9 (H) 10/24/2019   HGB 11.1 (L) 10/24/2019   HCT 34.8 (L) 10/24/2019   MCV 88.3 10/24/2019   PLT 294 10/24/2019    Assessment / Plan: IOL at 41+1wks, prolonged second stage labor.   Labor: s/p cytotec x 2, progressing on Pitocin and s/p SROM. 2nd stage labor- prolonged with minimal descent, s/p rest to labor down. Will resume pushing now and reassess in 1 hr for descent. Discussed with pt that fetal station is too high for assisted delivery and if no descent, will likely need cesarean.   Preeclampsia:  no e/o Pre-e Fetal Wellbeing:  Category II Pain Control:  Epidural I/D:  n/a Anticipated MOD:  NSVD  10/26/2019 Shwanda Soltis, CNM 10/25/2019, 5:03 AM

## 2019-10-25 NOTE — Lactation Note (Signed)
This note was copied from a baby's chart. Lactation Consultation Note  Patient Name: Boy Bernis Stecher [id number] Date: 10/25/2019   After third assist with breast feeding, blood glucoses are still low.  Left breast is smaller than right breast, but can hand express from each breast.  Fraser Din latches after a few attempts with strong, rhythmic sucking for 5 to 10 minutes and then falls asleep.  Because the last 3 blood glucoses have been below 40, DBM was given.  Assisted FOB to fingerfeed 15 ml of DBM via curved tip syringe.  Next blood glucose a little over an hour later was 51.  Set up Symphony DEBP to stimulate breasts and supplementation.  Mom has [employer].  Discussed breast pump selections at discharge.  Discussed normal newborn stomach size, supply and demand, normal course of lactation and routine newborn feeding patterns.  Lactation name and number written on white board and encouraged to call with any questions, concerns or assistance.  Maternal Data    Feeding Feeding Type: Formula  LATCH Score                   Interventions    Lactation Tools Discussed/Used     Consult Status      Louis Meckel 10/25/2019, 7:51 PM

## 2019-10-26 LAB — CBC
HCT: 21.4 % — ABNORMAL LOW (ref 36.0–46.0)
Hemoglobin: 6.9 g/dL — ABNORMAL LOW (ref 12.0–15.0)
MCH: 28.2 pg (ref 26.0–34.0)
MCHC: 32.2 g/dL (ref 30.0–36.0)
MCV: 87.3 fL (ref 80.0–100.0)
Platelets: 204 10*3/uL (ref 150–400)
RBC: 2.45 MIL/uL — ABNORMAL LOW (ref 3.87–5.11)
RDW: 16.4 % — ABNORMAL HIGH (ref 11.5–15.5)
WBC: 13.5 10*3/uL — ABNORMAL HIGH (ref 4.0–10.5)
nRBC: 0 % (ref 0.0–0.2)

## 2019-10-26 LAB — PREPARE RBC (CROSSMATCH)

## 2019-10-26 LAB — HEMOGLOBIN AND HEMATOCRIT, BLOOD
HCT: 25 % — ABNORMAL LOW (ref 36.0–46.0)
Hemoglobin: 8.2 g/dL — ABNORMAL LOW (ref 12.0–15.0)

## 2019-10-26 MED ORDER — SODIUM CHLORIDE 0.9% IV SOLUTION: Freq: Once | INTRAVENOUS | Status: AC

## 2019-10-26 MED ORDER — GABAPENTIN 100 MG PO CAPS
100.0000 mg | ORAL_CAPSULE | Freq: Three times a day (TID) | ORAL | Status: DC
  Administered 2019-10-26 – 2019-10-27 (×4): 100 mg via ORAL
  Filled 2019-10-26 (×4): qty 1

## 2019-10-26 MED ORDER — ACETAMINOPHEN 325 MG PO TABS
650.0000 mg | ORAL_TABLET | Freq: Once | ORAL | Status: AC
  Administered 2019-10-26: 650 mg via ORAL
  Filled 2019-10-26: qty 2

## 2019-10-26 MED ORDER — DIPHENHYDRAMINE HCL 25 MG PO CAPS
25.0000 mg | ORAL_CAPSULE | Freq: Once | ORAL | Status: AC
  Administered 2019-10-26: 25 mg via ORAL
  Filled 2019-10-26: qty 1

## 2019-10-26 NOTE — Progress Notes (Signed)
Reported 2037 Post Transfusion HGB and HCT to R. McVey CNM. No new orders received.

## 2019-10-26 NOTE — Lactation Note (Signed)
This note was copied from a baby's chart. Lactation Consultation Note  Patient Name: Boy Geralynn Capri [id number] Date: 10/26/2019   Fraser Din was in SCN overnight d/t intermittently low glucose screens.  He was brought back out to room with mom this am.  Mom's nipples are really painful.  She chooses for now not to put [place] to the breast. Neonatologist has ordered Fraser Din get 30 ml of Similac 20 calorie every 3 hours for adequate glucose control.   Mom plans to continue to stimulate breasts by pumping.  Symphony has been set up in room.  Discussed need for frequent stimulation to bring in mature milk, ensure a plentiful milk supply and prevent engorgement.  Reviewed warmth, breast massage, pumping, collection, storage, cleaning, labeling and handling of any expressed milk.  Mom has not expressed any milk with breast pump so far.  Explained that is was normal while in the colostrum phase for her not to be able to pump milk.  Praised mom for her commitment to continue to pump to try and provide her breast milk even though she is struggling with sore nipples.  Coconut oil and comfort gels have been given with instructions in alternating use.  Hand expression was encouraged when she felt she could tolerate to rub on nipples.  FOB works for [employer] and has [employer].  Sonata pump chose by parents from [employer] for home use after discharge.  Lactation community resource hand out given with contact numbers and encouraged to call with any questions, concerns or assistance.      Maternal Data    Feeding Feeding Type: Formula Nipple Type: Slow - flow  LATCH Score                   Interventions    Lactation Tools Discussed/Used Tools: Bottle   Consult Status      Louis Meckel 10/26/2019, 4:45 PM

## 2019-10-26 NOTE — Progress Notes (Signed)
Post Partum Day 1 Subjective: Doing well, no complaints.  Tolerating regular diet, pain with PO meds, voiding and ambulating without difficulty. Continues to have numbness and tingling in left leg, seen by anesthesia yesterday evening.   No CP SOB Fever,Chills, N/V or leg pain; denies nipple or breast pain no HA change of vision, RUQ/epigastric pain  Objective: BP 129/78 (BP Location: Left Arm)   Pulse 99   Temp 98.8 F (37.1 C) (Oral)   Resp 18   Ht 5\' 9"  (1.753 m)   Wt 102.1 kg   LMP 01/02/2019   SpO2 98% Comment: Room Air  Breastfeeding Unknown   BMI 33.23 kg/m    Physical Exam:  General: NAD Breasts: soft/nontender CV: RRR Pulm: nl effort, CTABL Abdomen: soft, NT, BS x 4 Perineum: minimal edema, lacerations hemostatic and repair well approximated Lochia: small Uterine Fundus: fundus firm and 2 fb below umbilicus DVT Evaluation: no cords, ttp LEs   Recent Labs    10/25/19 1200 10/26/19 0718  HGB 9.6* 6.9*  HCT 28.9* 21.4*  WBC 24.3* 13.5*  PLT 250 204    Assessment/Plan: 34 y.o. G2P1011 postpartum day # 1  - Continue routine PP care - Lactation consult prn.  - reviewed anesthesia notes regarding leg numbness, will start gabapentin for neuropathic pain, likely caused by maternal positioning with prolonged 2nd stage labor.  - Acute blood loss anemia - dizziness with ambulation and intermittent tachycardia; continue po ferrous sulfate BID with stool softeners. Will transfuse today with 2 units PRBCs.  - Immunization status:  all Imms up to date- declines vaccine.     Disposition: Does not desire Dc home today.   34, CNM 10/26/2019  9:14 AM

## 2019-10-26 NOTE — Progress Notes (Signed)
Anesthesiology note:       Called to evaluate patient with post left lower leg pins and needle numbness on the lateral aspect of the left lower leg with pain noted in left foot when standing.  There does not appear to be any motor involvement with the patient retaining good strength of lower leg and foot.  The history was notable for prolonged second stage of labor with prolonged pushing in lithotomy position.      I suspect that the patient may have some type of peroneal nerve neuropathy that should be transient, and thus the patient should be reassured.  The patient may benefit from either lyrica or gabapentin administration.  I would also suggest a neurology consult.               Alfonso Ramus MD

## 2019-10-26 NOTE — Anesthesia Postprocedure Evaluation (Signed)
Anesthesia Post Note  Patient: Katherine Graham  Procedure(s) Performed: AN AD HOC LABOR EPIDURAL  Patient location during evaluation: Mother Baby Anesthesia Type: Epidural Level of consciousness: awake and alert and oriented Pain management: pain level controlled Vital Signs Assessment: post-procedure vital signs reviewed and stable Respiratory status: spontaneous breathing Cardiovascular status: blood pressure returned to baseline Anesthetic complications: no Comments: Patient does endorse a left peroneal irritation with some numbness over the lateral side of calf and down into foot , with some pain noted on standing on foot.  I suspect with prolonged pushing in lithotomy position of over 4 and a half hours that the patient had some compression.  Patient reassured and suggested to staff gabapentin addition to cover above and perhaps neurology consult.   No complications documented.   Last Vitals:  Vitals:   10/26/19 0949 10/26/19 1022  BP: 118/74 124/72  Pulse: 86 89  Resp: 18 18  Temp: 36.7 C 36.9 C  SpO2: 98% 99%    Last Pain:  Vitals:   10/26/19 1022  TempSrc: Oral  PainSc:                  Tashi Band

## 2019-10-27 LAB — TYPE AND SCREEN
ABO/RH(D): O POS
Antibody Screen: NEGATIVE
Unit division: 0
Unit division: 0

## 2019-10-27 LAB — BPAM RBC
Blood Product Expiration Date: 202107212359
Blood Product Expiration Date: 202107222359
ISSUE DATE / TIME: 202106200945
ISSUE DATE / TIME: 202106201342
Unit Type and Rh: 5100
Unit Type and Rh: 5100

## 2019-10-27 MED ORDER — IBUPROFEN 600 MG PO TABS: 600.0000 mg | ORAL_TABLET | Freq: Four times a day (QID) | ORAL | Status: DC | PRN

## 2019-10-27 MED ORDER — DOCUSATE SODIUM 100 MG PO CAPS: 100.0000 mg | ORAL_CAPSULE | Freq: Every day | ORAL | Status: AC

## 2019-10-27 MED ORDER — GABAPENTIN 100 MG PO CAPS: 100.0000 mg | ORAL_CAPSULE | Freq: Three times a day (TID) | ORAL | 0 refills | Status: AC

## 2019-10-27 MED ORDER — LEVOTHYROXINE SODIUM 112 MCG PO TABS: 112.0000 ug | ORAL_TABLET | Freq: Every day | ORAL | 0 refills | Status: AC

## 2019-10-27 MED ORDER — ACETAMINOPHEN 325 MG PO TABS: 650.0000 mg | ORAL_TABLET | Freq: Four times a day (QID) | ORAL | Status: DC | PRN

## 2019-10-27 MED ORDER — FERROUS SULFATE 325 (65 FE) MG PO TABS: 325.0000 mg | ORAL_TABLET | Freq: Two times a day (BID) | ORAL | 3 refills | Status: AC

## 2019-10-27 NOTE — TOC Initial Note (Signed)
Transition of Care [facility] - Initial/Assessment Note    Patient Details  Name: Katherine Graham MRN: [id number] Date of Birth: [birth year]  [facility]) CM/SW Contact:    Anselm Pancoast, RN Phone Number: 10/27/2019, 2:59 PM  Clinical Narrative:                 Spoke with patient and FOB at bedside. Father was feeding infant during assessment while mother was eating lunch. Mother is concerned about getting infant circumcised this week due to the fact that her pediatrician does not do them. Reviewed post partum/baby blues and SIDS risks. Patient and father are both very aware of risk factors. Infant is having increased bilirubin and will remain inpatient overnight. Patient is monitoring bleeding due to birth complications and breastfeeding/pumping as able. Patient states infant was having problems with low glucose and is requiring some supplemental formula. Parents have no needs or concerns at this time and have all needed equipment in the home. Patient states she has no mental health issues and has no concerns regarding her past trauma.   RN CM confirmed with nurse and confirmed Daleen Snook will be setting up circumcision clinic appt this week. Infant and mother will have follow up appointment scheduled prior to discharge. Patient and FOB are very complimentary of care received at hospital and wanted to know who they could talk to about the amazing care. RN CM provided patient experience number. No other needs or concerns at this time.         Patient Goals and CMS Choice        Expected Discharge Plan and Services           Expected Discharge Date: 10/27/19                                    Prior Living Arrangements/Services                       Activities of Daily Living Home Assistive Devices/Equipment: None ADL Screening (condition at time of admission) Patient's cognitive ability adequate to safely complete daily activities?: Yes Is the patient deaf or  have difficulty hearing?: No Does the patient have difficulty seeing, even when wearing glasses/contacts?: No Does the patient have difficulty concentrating, remembering, or making decisions?: No Patient able to express need for assistance with ADLs?: No Does the patient have difficulty dressing or bathing?: No Independently performs ADLs?: Yes (appropriate for developmental age) Does the patient have difficulty walking or climbing stairs?: No Weakness of Legs: None Weakness of Arms/Hands: None  Permission Sought/Granted                  Emotional Assessment              Admission diagnosis:  Post-term pregnancy, 40-42 weeks of gestation [O48.0] Patient Active Problem List   Diagnosis Date Noted  . Post-term pregnancy, 40-42 weeks of gestation 10/24/2019  . Nausea/vomiting in pregnancy 09/24/2019  . Pregnancy 09/24/2019  . Decreased fetal movement 06/01/2019  . Supervision of other normal pregnancy, antepartum 03/01/2019  . GAD (generalized anxiety disorder) 12/04/2018  . Cervical radiculopathy 07/13/2016  . GERD without esophagitis 07/09/2015  . Migraine without aura and without status migrainosus, not intractable 07/09/2015   PCP:  Idelle Crouch, MD Pharmacy:   [place], [place]  [address] [place] [address] Phone: [phone] Fax: [phone]  [facility] - [place], [place] - [address] [place] [address] Phone: [phone] Fax: [phone]     Social Determinants of Health (SDOH) Interventions    Readmission Risk Interventions No flowsheet data found.

## 2019-10-27 NOTE — Progress Notes (Signed)
Patient discharged home with infant. Discharge instructions and prescriptions given and reviewed with patient. Patient verbalized understanding. Escorted out by auxillary.  

## 2019-10-27 NOTE — Discharge Instructions (Signed)

## 2019-10-31 ENCOUNTER — Other Ambulatory Visit: Payer: Self-pay

## 2019-11-11 ENCOUNTER — Other Ambulatory Visit: Payer: Self-pay

## 2019-11-11 ENCOUNTER — Emergency Department
Admission: EM | Admit: 2019-11-11 | Discharge: 2019-11-11 | Disposition: A | Discharge status: Home / Self Care | Payer: No Typology Code available for payment source | Attending: Emergency Medicine | Admitting: Emergency Medicine

## 2019-11-11 ENCOUNTER — Encounter: Payer: Self-pay | Admitting: Emergency Medicine

## 2019-11-11 DIAGNOSIS — E039 Hypothyroidism, unspecified: Secondary | ICD-10-CM | POA: Diagnosis not present

## 2019-11-11 DIAGNOSIS — R102 Pelvic and perineal pain: Secondary | ICD-10-CM

## 2019-11-11 LAB — URINALYSIS, COMPLETE (UACMP) WITH MICROSCOPIC
Bacteria, UA: NONE SEEN
Bilirubin Urine: NEGATIVE
Glucose, UA: NEGATIVE mg/dL
Ketones, ur: NEGATIVE mg/dL
Leukocytes,Ua: NEGATIVE
Nitrite: NEGATIVE
Protein, ur: NEGATIVE mg/dL
Specific Gravity, Urine: 1.018 (ref 1.005–1.030)
Squamous Epithelial / HPF: NONE SEEN (ref 0–5)
pH: 5 (ref 5.0–8.0)

## 2019-11-11 LAB — CBC
HCT: 38 % (ref 36.0–46.0)
Hemoglobin: 12.2 g/dL (ref 12.0–15.0)
MCH: 28.6 pg (ref 26.0–34.0)
MCHC: 32.1 g/dL (ref 30.0–36.0)
MCV: 89.2 fL (ref 80.0–100.0)
Platelets: 336 10*3/uL (ref 150–400)
RBC: 4.26 MIL/uL (ref 3.87–5.11)
RDW: 15.5 % (ref 11.5–15.5)
WBC: 8.7 10*3/uL (ref 4.0–10.5)
nRBC: 0 % (ref 0.0–0.2)

## 2019-11-11 LAB — BASIC METABOLIC PANEL
Anion gap: 12 (ref 5–15)
BUN: 21 mg/dL — ABNORMAL HIGH (ref 6–20)
CO2: 25 mmol/L (ref 22–32)
Calcium: 9.6 mg/dL (ref 8.9–10.3)
Chloride: 104 mmol/L (ref 98–111)
Creatinine, Ser: 0.99 mg/dL (ref 0.44–1.00)
GFR calc Af Amer: >60 mL/min (ref >60)
GFR calc non Af Amer: >60 mL/min (ref >60)
Glucose, Bld: 95 mg/dL (ref 70–99)
Potassium: 3.9 mmol/L (ref 3.5–5.1)
Sodium: 141 mmol/L (ref 135–145)

## 2019-11-11 LAB — POCT PREGNANCY, URINE: Preg Test, Ur: NEGATIVE

## 2019-11-11 LAB — TYPE AND SCREEN
ABO/RH(D): O POS
Antibody Screen: NEGATIVE

## 2019-11-11 MED ORDER — TRANEXAMIC ACID 650 MG PO TABS: 1300.0000 mg | ORAL_TABLET | Freq: Three times a day (TID) | ORAL | 0 refills | Status: AC

## 2019-11-11 MED ORDER — HYDROCODONE-ACETAMINOPHEN 5-325 MG PO TABS: 1.0000 | ORAL_TABLET | Freq: Three times a day (TID) | ORAL | 0 refills | Status: AC | PRN

## 2019-11-11 MED ORDER — HYDROCODONE-ACETAMINOPHEN 5-325 MG PO TABS
1.0000 | ORAL_TABLET | Freq: Once | ORAL | Status: AC
  Administered 2019-11-11: 1 via ORAL
  Filled 2019-11-11: qty 1

## 2019-11-11 NOTE — ED Triage Notes (Signed)
Pt in via POV, pt is 3 weeks postpartum, reports postpartum hemorrhage while in hospital, with another episode today, reports bleeding through 3 pads in a matter of minutes.  Bleeding has slowed down upon arrival.  Vitals WDL, NAD noted at this time.

## 2019-11-11 NOTE — ED Provider Notes (Signed)
[facility] Emergency Department Provider Note ____________________________________________  Time seen: 2031  I have reviewed the triage vital signs and the nursing notes.  HISTORY  Chief Complaint  Vaginal Bleeding  HPI Katherine Graham is a 34 y.o. female presents her self to the ED for evaluation of spontaneous vaginal bleeding just prior to arrival.  Patient is 3 weeks postpartum from a spontaneous vaginal delivery of a singleton pregnancy.  She did experience postpartum hemorrhage secondary to uterine atony.  She had about 2000 mL of blood loss, and received blood transfusion prior to discharge.  Patient also describes a second-degree perineal tear due to spontaneous delivery, that was repaired with 2-0 Vicryl.  Patient had a postpartum visit today, prior to onset of bleeding, and was noted to have a normal postpartum exam with no concerns of the perineal tear.  She had been discharged with Colace and MiraLAX for constipation.  She reports a meaningful stool prior to onset of bleeding, after taking Miralax.  She notes that at the time of spontaneous vaginal bleeding, she had been lying down, and had the sensation that she was wetting herself.  She describes copious amounts of bright red blood, from the vagina, that she attempted to contain using pads.  Patient use approximately 3 maxipads in a matter of minutes.  She presents now with bleeding nearly resolved.  She does report some pain tenderness to the perineum as well as some fullness to the perineum.  She denies any fevers, chills, or sweats.  Past Medical History:  Diagnosis Date  . Hypothyroidism     Patient Active Problem List   Diagnosis Date Noted  . Post-term pregnancy, 40-42 weeks of gestation 10/24/2019  . Nausea/vomiting in pregnancy 09/24/2019  . Pregnancy 09/24/2019  . Decreased fetal movement 06/01/2019  . Supervision of other normal pregnancy, antepartum 03/01/2019  . GAD (generalized anxiety  disorder) 12/04/2018  . Cervical radiculopathy 07/13/2016  . GERD without esophagitis 07/09/2015  . Migraine without aura and without status migrainosus, not intractable 07/09/2015    Past Surgical History:  Procedure Laterality Date  . INDUCED ABORTION     Age 16  . TONSILLECTOMY      Prior to Admission medications   Medication Sig Start Date End Date Taking? Authorizing Provider  acetaminophen (TYLENOL) 325 MG tablet Take 2 tablets (650 mg total) by mouth every 6 (six) hours as needed (for pain scale < 4). 10/27/19   Haroldine Laws, CNM  docusate sodium (COLACE) 100 MG capsule Take 1 capsule (100 mg total) by mouth daily. 10/27/19 10/26/20  Haroldine Laws, CNM  ferrous sulfate 325 (65 FE) MG tablet Take 1 tablet (325 mg total) by mouth 2 (two) times daily with a meal. 10/27/19   Haroldine Laws, CNM  gabapentin (NEURONTIN) 100 MG capsule Take 1 capsule (100 mg total) by mouth 3 (three) times daily for 14 days. 10/27/19 11/10/19  Haroldine Laws, CNM  HYDROcodone-acetaminophen (NORCO) 5-325 MG tablet Take 1 tablet by mouth 3 (three) times daily as needed for up to 3 days. 11/11/19 11/14/19  Dario Yono, Charlesetta Ivory, PA-C  ibuprofen (ADVIL) 600 MG tablet Take 1 tablet (600 mg total) by mouth every 6 (six) hours as needed. 10/27/19   Haroldine Laws, CNM  levothyroxine (SYNTHROID) 112 MCG tablet Take 1 tablet (112 mcg total) by mouth daily at 6 (six) AM. 10/28/19 12/27/19  Haroldine Laws, CNM  loratadine (CLARITIN) 10 MG tablet Take 10 mg by mouth daily.    [provider]  magnesium oxide (MAG-OX) 400 MG tablet Take 400 mg by mouth daily.    [provider]  Prenatal Vit-Fe Fumarate-FA (PRENATAL VITAMIN AND MINERAL PO)  08/22/18   [provider]  Probiotic Product (PROBIOTIC-10 ULTIMATE) CAPS Take 1 capsule by mouth daily.    [provider]  tranexamic acid (LYSTEDA) 650 MG TABS tablet Take 2 tablets (1,300 mg total) by mouth 3 (three) times daily for 3 days. 11/11/19  11/14/19  Abubakar Crispo, Charlesetta Ivory, PA-C  vitamin B-12 (CYANOCOBALAMIN) 500 MCG tablet Take 500 mcg by mouth daily.    [provider]    Allergies Patient has no known allergies.  Family History  Problem Relation Age of Onset  . Colon cancer Maternal Grandmother 63    Social History Social History   Tobacco Use  . Smoking status: Never Smoker  . Smokeless tobacco: Never Used  Vaping Use  . Vaping Use: Never used  Substance Use Topics  . Alcohol use: Not Currently  . Drug use: No    Review of Systems  Constitutional: Negative for fever. Cardiovascular: Negative for chest pain. Respiratory: Negative for shortness of breath. Gastrointestinal: Negative for abdominal pain, vomiting and diarrhea. Genitourinary: Negative for dysuria. Vaginal bleeding as above. Musculoskeletal: Negative for back pain. Skin: Negative for rash. Neurological: Negative for headaches, focal weakness or numbness. ____________________________________________  PHYSICAL EXAM:  VITAL SIGNS: ED Triage Vitals  Enc Vitals Group     BP 11/11/19 1711 131/84     Pulse Rate 11/11/19 1711 100     Resp 11/11/19 1711 16     Temp 11/11/19 1711 98 F (36.7 C)     Temp Source 11/11/19 1711 Oral     SpO2 11/11/19 1711 97 %     Weight 11/11/19 1707 199 lb (90.3 kg)     Height 11/11/19 1707 5\' 9"  (1.753 m)     Head Circumference --      Peak Flow --      Pain Score 11/11/19 1707 6     Pain Loc --      Pain Edu? --      Excl. in GC? --     Constitutional: Alert and oriented. Well appearing and in no distress. Head: Normocephalic and atraumatic. Eyes: Conjunctivae are normal. Normal extraocular movements Cardiovascular: Normal rate, regular rhythm. Normal distal pulses. Respiratory: Normal respiratory effort. No wheezes/rales/rhonchi. Gastrointestinal: Soft and nontender. No distention. GU: Normal external genitalia.  Perineal tear noted at the 5 o'clock position of the perineum.  Large gauge  Vicryl sutures in place.  No active bleeding is appreciated.  Some scant pink blood is noted at the vault.  Speculum exam is deferred at this time. Musculoskeletal: Nontender with normal range of motion in all extremities.  Neurologic:  Normal gait without ataxia. Normal speech and language. No gross focal neurologic deficits are appreciated. Skin:  Skin is warm, dry and intact. No rash noted. Psychiatric: Mood and affect are normal. Patient exhibits appropriate insight and judgment. ____________________________________________   LABS (pertinent positives/negatives) Labs Reviewed  BASIC METABOLIC PANEL - Abnormal; Notable for the following components:      Result Value   BUN 21 (*)    All other components within normal limits  URINALYSIS, COMPLETE (UACMP) WITH MICROSCOPIC - Abnormal; Notable for the following components:   Color, Urine YELLOW (*)    APPearance CLEAR (*)    Hgb urine dipstick SMALL (*)    All other components within normal limits  CBC  FIBRINOGEN  POC URINE PREG, ED  POCT PREGNANCY, URINE  TYPE AND SCREEN  ____________________________________________  PROCEDURES  Norco 5-325 mg PO  Procedures ____________________________________________  INITIAL IMPRESSION / ASSESSMENT AND PLAN / ED COURSE  DDX: post-partum hemorrhage, menorrhagia, UTI, vulvar abscess  Female patient in 3 weeks postpartum status post a spontaneous vaginal delivery complicated by postpartum hemorrhage.  She presents today with spontaneous vaginal bleeding following a normal postpartum visit.  Bleeding has resolved at the time of this exam.  Patient vital signs are stable, patient is hemodynamically stable with normal RBCs, platelets, and H&H.  She is stable for discharge home with outpatient follow-up with OB provider.  We will start her on TXA orally at 1300 mg 3 times daily for 3 days.  Patient and husband have been given return precautions and understand discharge  instructions.  ----------------------------------------- 10:56 PM on 11/11/2019 ----------------------------------------- S/w Margaretmary Eddy, CNM: she agrees patient is stable for outpatient follow-up. She is also agreeable with plan to d/c with TXA orally x 3 days.    DANIAL SISLEY was evaluated in Emergency Department on 11/11/2019 for the symptoms described in the history of present illness. She was evaluated in the context of the global COVID-19 pandemic, which necessitated consideration that the patient might be at risk for infection with the SARS-CoV-2 virus that causes COVID-19. Institutional protocols and algorithms that pertain to the evaluation of patients at risk for COVID-19 are in a state of rapid change based on information released by regulatory bodies including the CDC and federal and state organizations. These policies and algorithms were followed during the patient's care in the ED.  I reviewed the patient's prescription history over the last 12 months in the multi-state controlled substances database(s) that includes [place], [place], [place], [place], [place], [place], [place], [place], [place], [place], [place], [place], [place], and [place].  Results were notable for no current RX.  ____________________________________________  FINAL CLINICAL IMPRESSION(S) / ED DIAGNOSES  Final diagnoses:  Perineum pain, female  Delayed postpartum hemorrhage      Karmen Stabs, Charlesetta Ivory, PA-C 11/11/19 2331    Shaune Pollack, MD 11/15/19 2154

## 2019-11-11 NOTE — Discharge Instructions (Addendum)
Follow-up with [facility] for further management. Return to the ED as needed. Take the prescription TXA medicine as directed, take the pain medicine only as needed.

## 2019-12-17 ENCOUNTER — Other Ambulatory Visit: Payer: Self-pay

## 2019-12-17 ENCOUNTER — Ambulatory Visit: Payer: No Typology Code available for payment source | Admitting: Physical Therapy

## 2019-12-17 ENCOUNTER — Encounter: Payer: Self-pay | Admitting: Physical Therapy

## 2019-12-17 DIAGNOSIS — M25552 Pain in left hip: Secondary | ICD-10-CM | POA: Diagnosis present

## 2019-12-17 DIAGNOSIS — R2689 Other abnormalities of gait and mobility: Secondary | ICD-10-CM | POA: Diagnosis present

## 2019-12-17 DIAGNOSIS — M79672 Pain in left foot: Secondary | ICD-10-CM | POA: Diagnosis not present

## 2019-12-17 DIAGNOSIS — M533 Sacrococcygeal disorders, not elsewhere classified: Secondary | ICD-10-CM | POA: Diagnosis present

## 2019-12-17 NOTE — Patient Instructions (Addendum)
childs poses rocking with pillow under belly if need more support/ relaxation  childs poses rocking   Toes tucked, shoulders down and back, on forearms , hands shoulder width apart  10 reps   __   walking with more arm swing and trunk turns  Higher thighs    __  Sitting with feet on the ground   _______    Stretches :    6 directions of the spine :  5 reps   Rotation: armswings to rotate the spine  Side flexion: arm overhead arching like a rainbow , both sides  Forward bend and strainghtening up: minisquat-  Like you are about to sit and then stand up with a slight lift in the chest   Mini squat-figure 4 (piriformis) hip flexor stretch on step hip flexor twist on step    During the day:  Stretches : (Cuing provided for proper alignment6 directions 6 directions of psine   sidebend  Rotation with trunk and arm swings  minisquat ( knees behind toes) , pull arms back, rise up, chest lifts, look up   ____

## 2019-12-18 NOTE — Therapy (Addendum)
[place] [facility] [address] [place], [place], [address] Phone: [phone]   Fax:  [phone]  Physical Therapy Evaluation  Patient Details  Name: Katherine Graham MRN: [id number] Date of Birth: 1986/04/23 Referring Provider (PT): Margaretmary EddyAnna Mackie, CNM   Encounter Date: 12/17/2019   PT End of Session - 12/18/19 [id number]    Visit Number 1    Number of Visits 10    Date for PT Re-Evaluation 02/26/20    PT Start Time 1200    PT Stop Time 1300    PT Time Calculation (min) 60 min           Past Medical History:  Diagnosis Date  . Hypothyroidism     Past Surgical History:  Procedure Laterality Date  . INDUCED ABORTION     Age 89  . TONSILLECTOMY      There were no vitals filed for this visit.    Subjective Assessment - 12/17/19 1209    Subjective 1) Foot numbness 2/2 L & D: Pt delivered her child on 10/25/19 who weighed 9.2 lbs. Pt pushed for 4.5 hours with a total of 31 hours of labor.  No forceps/ vacuum were used. After birth, pt hemorrhaged with a loss of 2L of blood and received 2 blood transfusions. 2 weeks later, pt hemorrhaged again and with the help of the dispatcher on the phone,  husband was able to get the blood to stop with uterine massages before the ambulance arrived. Another blood transfusion was not needed.  Pt still has low energy and low appetite. Pt is taking iron supplements. When pt was treated after delivery for the hemorrhaging, pt was in the wheelchair and noticed numbness in B feet and later required assistance to walk to the bathroom. The numbness in her feet has come back by 75%. There is more numbness in her L foot > R.  Pt limps on her L foot because there is a tingling sensation along the soles of her foot that hurts to walk. There is also an electric rod sesnsation that comes and goes at random times ( includes anytime sitting and standing. walking) and thelocation of that sensations lies at the arch of the  foot. Pain 7-8/10. Walking barefoot is better but it hurts. Wearing a shoe can sometimes cause more of an irritation on the bottom of her foot.   2)  Straining with bowel movements 50% of the time. Currently with hemorrhoids due to pregnancy.    3) Pelvic pain with sexual intercourse:  pain located at the 2nd preineal tear. Pt expressed she has trauma from the birth.     4) L hip pain with radiating pain down lateral leg with L sidelying. Pt is not sleeping well due to this pain 6-7/10.      Pertinent History Hx of a Fx of her tailbone at the age of 34 without treatment. It caused alot of irritation when she was pregnant and she had to sit on a coccyx pillow at Week 20-41. Hx of sprained ankles as a child. Physical routine prior pregnancy: stairclimber, leg workouts : squats, Elliptical. Pt is able to walk on her elliptical during this time without increase in pain.  20 min.  Pt has a stretch rotuine after.  Denied SUI.    Patient Stated Goals foot and perineal scar to stop hurting.    Currently in Pain? Yes    Pain Location Foot    Pain Orientation Left  [facility] PT Assessment - 12/18/19 0834      Assessment   Medical Diagnosis pelvic pain    Referring Provider (PT) Margaretmary Eddy, CNM      Precautions   Precautions None      Restrictions   Weight Bearing Restrictions No      Balance Screen   Has the patient fallen in the past 6 months No      Observation/Other Assessments   Observations ankles crossed       Sensation   Light Touch Impaired Detail   L3,4,5, S1 L decreased    Light Touch Impaired Details --   L  L 3-5S1 , thigh, medial leg, all dorsum/ plantar of foot     Strength   Overall Strength Comments BLE 4-/5, DF/EV, PF OKC, toe flex/ext 3/5 L > R,   Pre Tx: hip ext B 2/5, Post Tx: 3/5 B        Palpation   SI assessment  R iliac crest slightly higher than R     Palpation comment tenderness at coccyx, deviation to R,        Ambulation/Gait   Gait  Comments limited thoracic rotation, armswings, guarded. L knee adducts on stance, toe in.     Pre Tx: 1.14 m/s  Post Tx: 1.28 m/s                      Objective measurements completed on examination: See above findings.       [facility] Adult PT Treatment/Exercise - 12/18/19 0834      Neuro Re-ed    Neuro Re-ed Details  cued for exercise and explained about etiology, anatomy/ physiology       Manual Therapy   Manual therapy comments long axis distraction L , rotational mob, superior mob at sacrum to promote mobility at SIJ and nutation of coccyx                       PT Long Term Goals - 12/18/19 8527      PT LONG TERM GOAL #1   Title Pt will demo improved SIJ mobility, levelled pelvic girdle, no deviation of coccyx, more nutation of sacrum  across 2 weeks in order to ambulate with less foot pain    Baseline R iliac crest higher , L SIJ hypomobile,    Time 2    Period Weeks    Status New    Target Date 01/01/20      PT LONG TERM GOAL #2   Title Pt will demo bilateral BLE sensation in order to minimize antalgic gait and return to walking and fitness    Baseline L  L 3-5S1 , thigh, medial leg, all dorsum/ plantar of foot    Time 6    Period Weeks    Status New    Target Date 01/29/20      PT LONG TERM GOAL #3   Title Pt will demo no adduction/ hip IR of L leg in gait and more foot mobility in order to improve neural mobility and regain sensation for ambulation    Time 8    Period Weeks    Status New    Target Date 02/12/20      PT LONG TERM GOAL #4   Title Pt will increase her FOTO score for Foot pain from 29 pts to > 60pts in order to improve QOL    Time 10    Period Weeks  Status New    Target Date 02/26/20      PT LONG TERM GOAL #5   Title Pt will report being able to sleep on her side without L hip pain in order to restore quality of sleep to care for her baby    Time 6    Period Weeks    Status New    Target Date 01/29/20       Additional Long Term Goals   Additional Long Term Goals --      PT LONG TERM GOAL #6   Title Pt will report decreaased straining with bowel movements from 50% of the time to no straining 100% of the time in order to promote pelvic floor function and minimize pelvic disorders    Time 8    Period Weeks    Status New    Target Date 02/12/20      PT LONG TERM GOAL #7   Title Pt will demo decreased perienal scar restrictions, optimial pelvic floor length and coordination to minimize pain during sexual intercourse.    Time 10    Period Weeks    Status New    Target Date 02/26/20                  Plan - 12/18/19 0828    Clinical Impression Statement  Pt is a 34 yo who is 7 weeks post-partum and is experiencing complications after delivery of her baby. Pt presents with numbness and pain in L foot, straining with bowel movements, pelvic pain, and L hip pain. These deficits impact sleeping, ambulation, elimination of bowel movements, and sexual intercourse. During her L &D, excessive pushing was involved and after L & D, pt hemorrhaged twice. Pt also has had a Hx of tailbone Fx in her teenage years and incurred 2nd degree perineal tear.   Pt's musculoskeletal assessment revealed unequal alignment of pelvic girdle, hypomobility of L SIJ, deviations of coccyx,  dyscoordination and strength of pelvic floor mm, decreased sensation on LLE L3-5, S1, weak hip weakness, and poor body mechanics which places strain on the abdominal/pelvic floor mm.    Pt was provided education on etiology of Sx with anatomy, physiology explanation with images along with the benefits of customized pelvic PT Tx based on pt's medical conditions and musculoskeletal deficits.  Explained the physiology of deep core mm coordination and roles of pelvic floor function in urination, defecation, sexual function, and postural control with deep core mm system.   Biopsychosocial and regional interdependent approaches will yield  greater benefits in pt's POC due to the complexity of her Sx and the significant impact their Sx have had on their QOL.    Following Tx today which pt tolerated without complaints, pt demo'd equal alignment of pelvic girdle, increased sensation on L leg/foot (preTx:  LLE L3-5, S1 decreased sensation) ( post Tx: L3 is the only area decreased). Pt demo'd less antalgic gait, increased gait speed, and reported less pain.   Plan to continue with manual Tx to faciliate improved gait and less pain. Assess pelvic floor at upcoming sessions.      Examination-Activity Limitations Locomotion Level;Toileting    Stability/Clinical Decision Making Evolving/Moderate complexity    Clinical Decision Making Moderate    Rehab Potential Good    PT Frequency --   10   PT Treatment/Interventions Moist Heat;Therapeutic activities;Patient/family education;Therapeutic exercise;Balance training;Manual techniques;Neuromuscular re-education;Scar mobilization;Taping    Consulted and Agree with Plan of Care Patient  Patient will benefit from skilled therapeutic intervention in order to improve the following deficits and impairments:  Decreased endurance, Decreased coordination, Abnormal gait, Decreased balance, Decreased mobility, Decreased strength, Postural dysfunction, Increased fascial restricitons, Increased muscle spasms, Pain, Improper body mechanics, Hypomobility, Decreased scar mobility, Decreased safety awareness  Visit Diagnosis: Pain in left foot - Plan: PT plan of care cert/re-cert  Pain in left hip - Plan: PT plan of care cert/re-cert  Other abnormalities of gait and mobility - Plan: PT plan of care cert/re-cert  Sacrococcygeal disorders, not elsewhere classified - Plan: PT plan of care cert/re-cert     Problem List Patient Active Problem List   Diagnosis Date Noted  . Post-term pregnancy, 40-42 weeks of gestation 10/24/2019  . Nausea/vomiting in pregnancy 09/24/2019  . Pregnancy  09/24/2019  . Decreased fetal movement 06/01/2019  . Supervision of other normal pregnancy, antepartum 03/01/2019  . GAD (generalized anxiety disorder) 12/04/2018  . Cervical radiculopathy 07/13/2016  . GERD without esophagitis 07/09/2015  . Migraine without aura and without status migrainosus, not intractable 07/09/2015    Mariane Masters ,PT, DPT, E-RYT  12/18/2019, 9:04 AM  [place] [facility] MAIN [facility] SERVICES [address] [place], [place], [address] Phone: [phone]   Fax:  [phone]  Name: Katherine Graham MRN: [id number] Date of Birth: 01-10-1986

## 2019-12-19 ENCOUNTER — Ambulatory Visit: Payer: No Typology Code available for payment source | Admitting: Physical Therapy

## 2019-12-19 ENCOUNTER — Other Ambulatory Visit: Payer: Self-pay

## 2019-12-19 DIAGNOSIS — R2689 Other abnormalities of gait and mobility: Secondary | ICD-10-CM

## 2019-12-19 DIAGNOSIS — M25552 Pain in left hip: Secondary | ICD-10-CM

## 2019-12-19 DIAGNOSIS — M79672 Pain in left foot: Secondary | ICD-10-CM

## 2019-12-19 NOTE — Therapy (Signed)
[place] [facility] MAIN [facility] SERVICES [address] [place], [place], [address] Phone: [phone]   Fax:  [phone]  Physical Therapy Treatment  Patient Details  Name: Katherine Graham MRN: [id number] Date of Birth: October 31, 1985 Referring Provider (PT): Margaretmary Eddy, CNM   Encounter Date: 12/19/2019   PT End of Session - 12/19/19 1102    Visit Number 2    Number of Visits 10    Date for PT Re-Evaluation 02/26/20    PT Start Time 1002    PT Stop Time 1102    PT Time Calculation (min) 60 min           Past Medical History:  Diagnosis Date  . Hypothyroidism     Past Surgical History:  Procedure Laterality Date  . INDUCED ABORTION     Age 34  . TONSILLECTOMY      There were no vitals filed for this visit.   Subjective Assessment - 12/19/19 1008    Subjective Pt reported she slept better the next day after the session. Pt is feeling more sensation in the L foot and she has been able to feel her foot when she touches her foot. The sensation has come back so much she can feel the Lbottom foot as numb and the R bottom sole is no longer numb. The pain is at the medial arch and it throbs and aches.    Pertinent History Hx of a Fx of her tailbone at the age of 44 without treatment. It caused alot of irritation when she was pregnant and she had to sit on a coccyx pillow at Week 20-41. Hx of sprained ankles as a child. Physical routine prior pregnancy: stairclimber, leg workouts : squats, Elliptical. Pt is able to walk on her elliptical during this time without increase in pain.  20 min.  Pt has a stretch rotuine after.  Denied SUI.    Patient Stated Goals foot and perineal scar to stop hurting.              [facility] PT Assessment - 12/19/19 1050      Coordination   Gross Motor Movements are Fluid and Coordinated --   knee adduction in eccentric phase of heel      Strength   Overall Strength Comments B hip ext 4-/5       Palpation   SI assessment   coccyx medially aligned, no tenderness    Palpation comment L ankle mobility limited in DF/EV, toe abduction  L > R       Ambulation/Gait   Gait Comments Pre Tx: 1.3 m/s ( Post Tx 1.4 m/s more push off                          [facility] Adult PT Treatment/Exercise - 12/19/19 1050      Neuro Re-ed    Neuro Re-ed Details  cued DF/EV with knee propioception in seated position, self feet massage to promote feet mobility         Manual Therapy   Manual therapy comments STM/MWM L intrinsic mm, AP/PA mob to promote DF/ EV and toe abduction.  manual lymph massage to minimize swelling at BLE ( trunk/ BLE)                         PT Long Term Goals - 12/18/19 0833      PT LONG TERM GOAL #1  Title Pt will demo improved SIJ mobility, levelled pelvic girdle, no deviation of coccyx, more nutation of sacrum  across 2 weeks in order to ambulate with less foot pain    Baseline R iliac crest higher , L SIJ hypomobile,    Time 2    Period Weeks    Status New    Target Date 01/01/20      PT LONG TERM GOAL #2   Title Pt will demo bilateral BLE sensation in order to minimize antalgic gait and return to walking and fitness    Baseline L  L 3-5S1 , thigh, medial leg, all dorsum/ plantar of foot    Time 6    Period Weeks    Status New    Target Date 01/29/20      PT LONG TERM GOAL #3   Title Pt will demo no adduction/ hip IR of L leg in gait and more foot mobility in order to improve neural mobility and regain sensation for ambulation    Time 8    Period Weeks    Status New    Target Date 02/12/20      PT LONG TERM GOAL #4   Title Pt will increase her FOTO score for Foot pain from 29 pts to > 60pts in order to improve QOL    Time 10    Period Weeks    Status New    Target Date 02/26/20      PT LONG TERM GOAL #5   Title Pt will report being able to sleep on her side without L hip pain in order to restore quality of sleep to care for her baby    Time 6    Period  Weeks    Status New    Target Date 01/29/20      Additional Long Term Goals   Additional Long Term Goals --      PT LONG TERM GOAL #6   Title Pt will report decreaased straining with bowel movements from 50% of the time to no straining 100% of the time in order to promote pelvic floor function and minimize pelvic disorders    Time 8    Period Weeks    Status New    Target Date 02/12/20      PT LONG TERM GOAL #7   Title Pt will demo decreased perienal scar restrictions, optimial pelvic floor length and coordination to minimize pain during sexual intercourse.    Time 10    Period Weeks    Status New    Target Date 02/26/20                 Plan - 12/19/19 1214    Clinical Impression Statement Pt demo'd good carry over with more medially aligned coccyx, more SIJ mobility, and increased gait speed with more normal gait pattern. Manual Tx today helped to promote feet mobility and decrease swelling in legs.  Plan to continue to address decreased L5/S1 dermatome and pt's planus cavus. Pt reported she was able to curl toes and spread them post Tx which she was not able to do prior to PT. Added pain science education and mindfulness training to help minimize pain. Pt continues to benefit from skilled PT.    Examination-Activity Limitations Locomotion Level;Toileting    Stability/Clinical Decision Making Evolving/Moderate complexity    Rehab Potential Good    PT Frequency -- -  10   PT Treatment/Interventions Moist Heat;Therapeutic activities;Patient/family education;Therapeutic exercise;Balance training;Manual techniques;Neuromuscular re-education;Scar mobilization;Taping  Consulted and Agree with Plan of Care Patient           Patient will benefit from skilled therapeutic intervention in order to improve the following deficits and impairments:  Decreased endurance, Decreased coordination, Abnormal gait, Decreased balance, Decreased mobility, Decreased strength, Postural  dysfunction, Increased fascial restricitons, Increased muscle spasms, Pain, Improper body mechanics, Hypomobility, Decreased scar mobility, Decreased safety awareness  Visit Diagnosis: No diagnosis found.     Problem List Patient Active Problem List   Diagnosis Date Noted  . Post-term pregnancy, 40-42 weeks of gestation 10/24/2019  . Nausea/vomiting in pregnancy 09/24/2019  . Pregnancy 09/24/2019  . Decreased fetal movement 06/01/2019  . Supervision of other normal pregnancy, antepartum 03/01/2019  . GAD (generalized anxiety disorder) 12/04/2018  . Cervical radiculopathy 07/13/2016  . GERD without esophagitis 07/09/2015  . Migraine without aura and without status migrainosus, not intractable 07/09/2015    Mariane Masters 12/19/2019, 12:14 PM  [place] [facility] MAIN [facility] SERVICES [address] [place], [place], [address] Phone: [phone]   Fax:  [phone]  Name: TAURIEL SCRONCE MRN: [id number] Date of Birth: 1985-07-23

## 2019-12-19 NOTE — Patient Instructions (Addendum)
Feet care :  Self -feet massage   Handshake : fingers between toes, moving ballmounds/toes back and forth several times while other hand anchors at arch. Do the same at the hind/mid foot.  Heel to toes upward to a letter Big Letter T strokes to spread ballmounds and toes, several times, pinch between webs of toes  Run finger tips along top of foot between long bones "comb between the bones"    Wiggle toes and spread them out when relaxing   ___   Feet slides :   Points of contact at sitting bones  Four points of contact of foot, Heel up, ankle not twist out Lower heel, keep knee out and not let it go in  Four points of contact of foot, Slide foot back   Repeated with other foot    ___   Calf stretches to promote dorsiflexion of ankle   Front knee above ankle   10 reps   x 3 x day

## 2019-12-22 ENCOUNTER — Telehealth: Payer: Self-pay | Admitting: Physical Therapy

## 2019-12-22 ENCOUNTER — Other Ambulatory Visit
Admission: RE | Admit: 2019-12-22 | Discharge: 2019-12-22 | Disposition: A | Discharge status: Home / Self Care | Payer: No Typology Code available for payment source | Source: Ambulatory Visit | Attending: Internal Medicine | Admitting: Internal Medicine

## 2019-12-22 DIAGNOSIS — M79662 Pain in left lower leg: Secondary | ICD-10-CM | POA: Diagnosis not present

## 2019-12-22 DIAGNOSIS — M7989 Other specified soft tissue disorders: Secondary | ICD-10-CM | POA: Diagnosis not present

## 2019-12-22 LAB — FIBRIN DERIVATIVES D-DIMER (ARMC ONLY): Fibrin derivatives D-dimer (ARMC): 198.6 ng/mL (FEU) (ref 0.00–499.00)

## 2019-12-22 NOTE — Telephone Encounter (Signed)
Physical Therapist (DPT)  returned pt's email re: her increased pain in LLE started on Saturday after her 2nd Tx which is was Friday.    Pt reported in email with the following Sx:  "-Left hip, glute, knee, behind knee, calf, shin, whole foot all excruciatingly painful burning/throbbing/aching and feels like it's being squeezed. -feels like a rock is in the bottom of my foot and it moves around very painful.  -very painful sensations in whole left foot: also tips of toes, cannot put shoe on it also hurts to put sock on it.  -very painful to stand on left leg and very much the left foot. -feels like left foot is squeezing tighter and tighter and going to explode.  -Tylenol not helping at all.  -all of this pain started Saturday morning and increased over weekend, feels worse right now (1:45 pm Monday) -started having trouble sleeping as of Friday night bc of pain. Had worse trouble sleeping Saturday night also and hardly slept last night bc of pain. Woke up with blood shot eyes from the wrestles sleep. -As of now I can barely put weight on my left foot and leg and am currently restricted to sitting up in bed trying to work on my laptop because it hurts so bad to sit in a chair. It's difficult to care for my newborn today bc of the pain. I was in so mucgh pain last night i barely got sleep. I'm very worried "   DPT called pt's PCP to help pt get an appt but nothing was available today. Advocated pt to go the [place] [facility] this afternoon and educated the importance of ruling out blood clot. Recommended if Sx continues to worsen, visit ER but pt prefers to no go to the ER. Pt voiced she will go the Urgent Care/ Walk -in this afternoon.

## 2019-12-23 ENCOUNTER — Ambulatory Visit: Payer: No Typology Code available for payment source | Admitting: Physical Therapy

## 2019-12-23 ENCOUNTER — Telehealth: Payer: Self-pay | Admitting: Physical Therapy

## 2019-12-23 NOTE — Telephone Encounter (Signed)
DPT phone pt to check in with pt's pain status. Pt reported her foot swelling has gone down this morning. Her Urgent Care visit confirmed a pinched nerve and r/o DVT.

## 2019-12-30 ENCOUNTER — Ambulatory Visit: Payer: No Typology Code available for payment source | Admitting: Physical Therapy

## 2019-12-30 ENCOUNTER — Other Ambulatory Visit: Payer: Self-pay

## 2019-12-30 DIAGNOSIS — R2689 Other abnormalities of gait and mobility: Secondary | ICD-10-CM

## 2019-12-30 DIAGNOSIS — M25552 Pain in left hip: Secondary | ICD-10-CM

## 2019-12-30 DIAGNOSIS — M79672 Pain in left foot: Secondary | ICD-10-CM | POA: Diagnosis not present

## 2019-12-30 NOTE — Therapy (Signed)
[place] [facility] MAIN [facility] SERVICES [address] [place], [place], [address] Phone: [phone]   Fax:  [phone]  Physical Therapy Treatment  Patient Details  Name: Katherine Graham MRN: [id number] Date of Birth: 1986/01/14 Referring Provider (PT): Margaretmary Eddy, CNM   Encounter Date: 12/30/2019   PT End of Session - 12/30/19 0909    Visit Number 3    Number of Visits 10    Date for PT Re-Evaluation 02/26/20    PT Start Time 0807    PT Stop Time 0900    PT Time Calculation (min) 53 min           Past Medical History:  Diagnosis Date  . Hypothyroidism     Past Surgical History:  Procedure Laterality Date  . INDUCED ABORTION     Age 34  . TONSILLECTOMY      There were no vitals filed for this visit.   Subjective Assessment - 12/30/19 0812    Subjective Last week pt went to the Urgent Care as advised by PT. Pt was told by MD that imaging showed pinch nerve and DVT was r/o.  Medication helped with decreasing her pain. Pt is off her meds now and pain is bearable. The shooting pain occurs randomly from L back to the heel and inside arch. Throbbing and squeezing in her L calf and foot when sitting in a chair and long sitting on her couch. The Throbbign and squeezing sensation does not occur when she is stepping.    Pertinent History Hx of a Fx of her tailbone at the age of 45 without treatment. It caused alot of irritation when she was pregnant and she had to sit on a coccyx pillow at Week 20-41. Hx of sprained ankles as a child. Physical routine prior pregnancy: stairclimber, leg workouts : squats, Elliptical. Pt is able to walk on her elliptical during this time without increase in pain.  20 min.  Pt has a stretch rotuine after.  Denied SUI.    Patient Stated Goals foot and perineal scar to stop hurting.              [facility] PT Assessment - 12/30/19 1028      Palpation   Spinal mobility hypomobile upper lumbar segments/ lower thoracic  segments, tightness at medial scapula L     SI assessment  L iliac crest is higher standing and supine,( pre Tx)  ,  levelled iliac crest ( post Tx )        Ambulation/Gait   Gait Pattern --   slight L pelvic sway, L hip higher  ( Post Tx: reciporcal ga                        [facility] Adult PT Treatment/Exercise - 12/30/19 1028      Therapeutic Activites    Therapeutic Activities --   proper ways to long sit to minimize radiating pain      Neuro Re-ed    Neuro Re-ed Details  cued for stretches with strap       Manual Therapy   Manual therapy comments rotational mob at lower thoracic/ upper lumbar segments, STM/MWM at thoracic mm at medial scapula                        PT Long Term Goals - 12/18/19 7062      PT LONG TERM GOAL #1   Title  Pt will demo improved SIJ mobility, levelled pelvic girdle, no deviation of coccyx, more nutation of sacrum  across 2 weeks in order to ambulate with less foot pain    Baseline R iliac crest higher , L SIJ hypomobile,    Time 2    Period Weeks    Status New    Target Date 01/01/20      PT LONG TERM GOAL #2   Title Pt will demo bilateral BLE sensation in order to minimize antalgic gait and return to walking and fitness    Baseline L  L 3-5S1 , thigh, medial leg, all dorsum/ plantar of foot    Time 6    Period Weeks    Status New    Target Date 01/29/20      PT LONG TERM GOAL #3   Title Pt will demo no adduction/ hip IR of L leg in gait and more foot mobility in order to improve neural mobility and regain sensation for ambulation    Time 8    Period Weeks    Status New    Target Date 02/12/20      PT LONG TERM GOAL #4   Title Pt will increase her FOTO score for Foot pain from 29 pts to > 60pts in order to improve QOL    Time 10    Period Weeks    Status New    Target Date 02/26/20      PT LONG TERM GOAL #5   Title Pt will report being able to sleep on her side without L hip pain in order to restore quality of  sleep to care for her baby    Time 6    Period Weeks    Status New    Target Date 01/29/20      Additional Long Term Goals   Additional Long Term Goals --      PT LONG TERM GOAL #6   Title Pt will report decreaased straining with bowel movements from 50% of the time to no straining 100% of the time in order to promote pelvic floor function and minimize pelvic disorders    Time 8    Period Weeks    Status New    Target Date 02/12/20      PT LONG TERM GOAL #7   Title Pt will demo decreased perienal scar restrictions, optimial pelvic floor length and coordination to minimize pain during sexual intercourse.    Time 10    Period Weeks    Status New    Target Date 02/26/20                 Plan - 12/30/19 1037    Clinical Impression Statement Pt demo'd good over with less antalgic gait upon arrival but showed gait deviations with higher L hip.  L iliac crest height was higher than R which was addressed with manual Tx to correct deviations/ hypomobility at lower thoracic/ upper lumbar segments. Pt demo'd equal iliac crest height post Tx and less pain with gait. Pt was provided stretches to improve joint mobility at lower kinetic chain. Plan to add deep core strengthening for stronger pelvic girdle stability. Pt continues to benefit from skilled PT.    Examination-Activity Limitations Locomotion Level;Toileting    Stability/Clinical Decision Making Evolving/Moderate complexity    Rehab Potential Good    PT Frequency --   10   PT Treatment/Interventions Moist Heat;Therapeutic activities;Patient/family education;Therapeutic exercise;Balance training;Manual techniques;Neuromuscular re-education;Scar mobilization;Taping    Consulted  and Agree with Plan of Care Patient           Patient will benefit from skilled therapeutic intervention in order to improve the following deficits and impairments:  Decreased endurance, Decreased coordination, Abnormal gait, Decreased balance, Decreased  mobility, Decreased strength, Postural dysfunction, Increased fascial restricitons, Increased muscle spasms, Pain, Improper body mechanics, Hypomobility, Decreased scar mobility, Decreased safety awareness  Visit Diagnosis: Pain in left hip  Other abnormalities of gait and mobility  Pain in left foot     Problem List Patient Active Problem List   Diagnosis Date Noted  . Post-term pregnancy, 40-42 weeks of gestation 10/24/2019  . Nausea/vomiting in pregnancy 09/24/2019  . Pregnancy 09/24/2019  . Decreased fetal movement 06/01/2019  . Supervision of other normal pregnancy, antepartum 03/01/2019  . GAD (generalized anxiety disorder) 12/04/2018  . Cervical radiculopathy 07/13/2016  . GERD without esophagitis 07/09/2015  . Migraine without aura and without status migrainosus, not intractable 07/09/2015    Mariane Masters ,PT, DPT, E-RYT  12/30/2019, 10:41 AM  [place] [facility] MAIN [facility] SERVICES [address] [place], [place], [address] Phone: [phone]   Fax:  [phone]  Name: LASHUN RAMSEYER MRN: [id number] Date of Birth: [birth year]

## 2019-12-30 NOTE — Patient Instructions (Signed)
Stretches : (Cuing provided for proper alignment)  Stretches for your legs: LAYING on Back Use upper arms and elbows for stability when pulling strap Opposite knee bent and foot firm in align with hip   Strap on ballmound:  Hip socket  _strap, L knee bent, R ballmound against strap and spread toes, rolling foot 15 deg out and in across midline.  10 reps each side   Hamstring _knee bends  10 reps  With knee pointing straight     10 reps with knee pointing out towards armpit   Adductors and pelvic floor ( Happy Baby) : Esmond Harps are wide towards armpits, sole of feet towards ceiling   IT band _scoot hips to R, cross R leg over L and straighten knee with strap on ballmound,    __  Ankle pumps with strap    ___  Elevate legs onto bolsters and pillows   ___ Long sitting with a pillow under knees to minimize pain

## 2020-01-06 ENCOUNTER — Ambulatory Visit: Payer: No Typology Code available for payment source | Admitting: Physical Therapy

## 2020-01-06 ENCOUNTER — Other Ambulatory Visit: Payer: Self-pay

## 2020-01-06 DIAGNOSIS — R2689 Other abnormalities of gait and mobility: Secondary | ICD-10-CM

## 2020-01-06 DIAGNOSIS — M25552 Pain in left hip: Secondary | ICD-10-CM

## 2020-01-06 DIAGNOSIS — M79672 Pain in left foot: Secondary | ICD-10-CM | POA: Diagnosis not present

## 2020-01-06 DIAGNOSIS — M533 Sacrococcygeal disorders, not elsewhere classified: Secondary | ICD-10-CM

## 2020-01-06 NOTE — Therapy (Signed)
[place] [facility] MAIN [facility] SERVICES [address] [place], [place], [address] Phone: [phone]   Fax:  [phone]  Physical Therapy Treatment  Patient Details  Name: Katherine Graham MRN: [id number] Date of Birth: [birth year] Referring Provider (PT): Margaretmary Eddy, CNM   Encounter Date: 01/06/2020   PT End of Session - 01/06/20 0947    Visit Number 4    Number of Visits 10    Date for PT Re-Evaluation 02/26/20    PT Start Time 0804    PT Stop Time 0900    PT Time Calculation (min) 56 min           Past Medical History:  Diagnosis Date  . Hypothyroidism     Past Surgical History:  Procedure Laterality Date  . INDUCED ABORTION     Age 34  . TONSILLECTOMY      There were no vitals filed for this visit.   Subjective Assessment - 01/06/20 0806    Subjective Pt felt good with 0-1/10 pain with walking out of the clinic after last session until she had to wear shoes the next day.   When she puts on enclosed shoe, her foot is in pain for days after. Sunday evening she wore the shoe. Yesterday ( monday) 7/10.  Today 0/10 with sitting, walking with thorbbing 3/10 but pt is wearing flop flops. Foot still feels numb. The swelling has gone down a lot. Shooting pain from low back has resolved.    Pertinent History Hx of a Fx of her tailbone at the age of 34 without treatment. It caused alot of irritation when she was pregnant and she had to sit on a coccyx pillow at Week 20-41. Hx of sprained ankles as a child. Physical routine prior pregnancy: stairclimber, leg workouts : squats, Elliptical. Pt is able to walk on her elliptical during this time without increase in pain.  20 min.  Pt has a stretch rotuine after.  Denied SUI.    Patient Stated Goals foot and perineal scar to stop hurting.              [facility] PT Assessment - 01/06/20 0820      Strength   Overall Strength Comments PF with single UE support, pain with 1st rep, MMT 4/5  L, hip abd 3+/5  B     B hip 4+/5 w/ oblique overuse      Palpation   SI assessment  iliac crest levelled     Palpation comment tenderness / tightness at glut med, IT band, hypomobility at SIJ L lacking nutation                          [facility] Adult PT Treatment/Exercise - 01/06/20 1025      Neuro Re-ed    Neuro Re-ed Details  cued for new SI HEP       Manual Therapy   Manual therapy comments rotational mob. PA mob at L SIJ to promote sacral ext, nutation   Moist pack on sacrum during instruction of HEP                        PT Long Term Goals - 12/18/19 0086      PT LONG TERM GOAL #1   Title Pt will demo improved SIJ mobility, levelled pelvic girdle, no deviation of coccyx, more nutation of sacrum  across 2 weeks in order to ambulate with  less foot pain    Baseline R iliac crest higher , L SIJ hypomobile,    Time 2    Period Weeks    Status New    Target Date 01/01/20      PT LONG TERM GOAL #2   Title Pt will demo bilateral BLE sensation in order to minimize antalgic gait and return to walking and fitness    Baseline L  L 3-5S1 , thigh, medial leg, all dorsum/ plantar of foot    Time 6    Period Weeks    Status New    Target Date 01/29/20      PT LONG TERM GOAL #3   Title Pt will demo no adduction/ hip IR of L leg in gait and more foot mobility in order to improve neural mobility and regain sensation for ambulation    Time 8    Period Weeks    Status New    Target Date 02/12/20      PT LONG TERM GOAL #4   Title Pt will increase her FOTO score for Foot pain from 29 pts to > 60pts in order to improve QOL    Time 10    Period Weeks    Status New    Target Date 02/26/20      PT LONG TERM GOAL #5   Title Pt will report being able to sleep on her side without L hip pain in order to restore quality of sleep to care for her baby    Time 6    Period Weeks    Status New    Target Date 01/29/20      Additional Long Term Goals   Additional Long Term Goals --        PT LONG TERM GOAL #6   Title Pt will report decreaased straining with bowel movements from 50% of the time to no straining 100% of the time in order to promote pelvic floor function and minimize pelvic disorders    Time 8    Period Weeks    Status New    Target Date 02/12/20      PT LONG TERM GOAL #7   Title Pt will demo decreased perienal scar restrictions, optimial pelvic floor length and coordination to minimize pain during sexual intercourse.    Time 10    Period Weeks    Status New    Target Date 02/26/20                 Plan - 01/06/20 1023    Clinical Impression Statement Pt is progressing well with report of resolution of radiating LBP. Pt demo'd improved coccyx alignment but continued to require more moanual Tx to promote  SIJ mobility on L. Anticipate today's session will help improve decreased sensation at leg which is not as widespread compared to previous sessions ( only L5/S1 lateral leg and plantar surface). Recommended looser shoe wear to help pt experience less pain when wearing shoes. Plan to add hip strengthening at next session. Pt continues to benefit from skilled PT     Examination-Activity Limitations Locomotion Level;Toileting    Stability/Clinical Decision Making Evolving/Moderate complexity    Rehab Potential Good    PT Frequency --   10   PT Treatment/Interventions Moist Heat;Therapeutic activities;Patient/family education;Therapeutic exercise;Balance training;Manual techniques;Neuromuscular re-education;Scar mobilization;Taping    Consulted and Agree with Plan of Care Patient           Patient will benefit from skilled therapeutic intervention in  order to improve the following deficits and impairments:  Decreased endurance, Decreased coordination, Abnormal gait, Decreased balance, Decreased mobility, Decreased strength, Postural dysfunction, Increased fascial restricitons, Increased muscle spasms, Pain, Improper body mechanics, Hypomobility,  Decreased scar mobility, Decreased safety awareness  Visit Diagnosis: Pain in left hip  Other abnormalities of gait and mobility  Pain in left foot  Sacrococcygeal disorders, not elsewhere classified     Problem List Patient Active Problem List   Diagnosis Date Noted  . Post-term pregnancy, 40-42 weeks of gestation 10/24/2019  . Nausea/vomiting in pregnancy 09/24/2019  . Pregnancy 09/24/2019  . Decreased fetal movement 06/01/2019  . Supervision of other normal pregnancy, antepartum 03/01/2019  . GAD (generalized anxiety disorder) 12/04/2018  . Cervical radiculopathy 07/13/2016  . GERD without esophagitis 07/09/2015  . Migraine without aura and without status migrainosus, not intractable 07/09/2015    Mariane Masters ,PT, DPT, E-RYT  01/06/2020, 10:37 AM  [place] [facility] MAIN [facility] SERVICES [address] [place], [place], [address] Phone: [phone]   Fax:  [phone]  Name: Katherine Graham MRN: [id number] Date of Birth: 1986-04-28

## 2020-01-06 NOTE — Patient Instructions (Addendum)
Frog stretch: laying on belly with pillow under hips, knees bent, inhale do nothing, exhale let ankles fall apart    20 reps   ___   Prone Heel Press for strengthening sacro-iliac joints  1. Lie on your belly. If you have an arch in your low back or it feels umcomfortable, place a pillow under your low belly/hips to make sure your low back feel comfortable.   2. Place our forehead on top of your palms.      Widen your knees apart for starting position.   3. Inhale, feel belly and low back expand  4. Exhale, feel belly hug in, press heel together and count aloud for 5 sec. Then relax the heel squeezing.  Perform 10 reps of 5 sec holds. 2 sets/ day.    If you feel entire buttock tighten too much or feel low back pain, apply 50% less effort. As you press your heel together, you will feel as if your pubic bone (front of your pelvis) and sacrum (back of your pelvis) gentle move towards each other or your low abdominal muscles hug in more.       __  1) Figure-4      2) Reclined twist for hips and side of the hips/ legs  Lay on your back, knees bend Scoot hips to the L , leave shoulders in place Drop knees to the R side resting onto pillows to keep leg at the same width of hips Pillow under L thigh to minimize too much strain

## 2020-01-13 ENCOUNTER — Ambulatory Visit: Payer: No Typology Code available for payment source | Admitting: Physical Therapy

## 2020-01-22 ENCOUNTER — Ambulatory Visit: Payer: No Typology Code available for payment source | Admitting: Physical Therapy

## 2020-01-22 ENCOUNTER — Other Ambulatory Visit: Payer: Self-pay

## 2020-01-22 DIAGNOSIS — M79672 Pain in left foot: Secondary | ICD-10-CM | POA: Insufficient documentation

## 2020-01-22 DIAGNOSIS — M25552 Pain in left hip: Secondary | ICD-10-CM | POA: Diagnosis not present

## 2020-01-22 DIAGNOSIS — R2689 Other abnormalities of gait and mobility: Secondary | ICD-10-CM | POA: Diagnosis present

## 2020-01-22 DIAGNOSIS — M533 Sacrococcygeal disorders, not elsewhere classified: Secondary | ICD-10-CM | POA: Diagnosis present

## 2020-01-22 NOTE — Therapy (Signed)
[place] [facility] MAIN [facility] SERVICES [address] [place], [place], [address] Phone: [phone]   Fax:  [phone]  Physical Therapy Treatment  Patient Details  Name: Katherine Graham MRN: [id number] Date of Birth: [birth year] Referring Provider (PT): Margaretmary Eddy, CNM   Encounter Date: 01/22/2020   PT End of Session - 01/22/20 3875    Visit Number 5    Number of Visits 10    Date for PT Re-Evaluation 02/26/20    PT Start Time 0802    PT Stop Time 0902    PT Time Calculation (min) 60 min           Past Medical History:  Diagnosis Date  . Hypothyroidism     Past Surgical History:  Procedure Laterality Date  . INDUCED ABORTION     Age 45  . TONSILLECTOMY      There were no vitals filed for this visit.   Subjective Assessment - 01/22/20 0814    Subjective over the past 2 weeks, pt expereinced less foot pain and was able to walk in stores owhile wearing crocs. The shooting pain at the foot is still present but it stops quicker. Pt notices it when long sitting onthe couch watching TV and sitting in office chair and lying on her back to sleep.    Pertinent History Hx of a Fx of her tailbone at the age of 57 without treatment. It caused alot of irritation when she was pregnant and she had to sit on a coccyx pillow at Week 20-41. Hx of sprained ankles as a child. Physical routine prior pregnancy: stairclimber, leg workouts : squats, Elliptical. Pt is able to walk on her elliptical during this time without increase in pain.  20 min.  Pt has a stretch rotuine after.  Denied SUI.    Patient Stated Goals foot and perineal scar to stop hurting.              [facility] PT Assessment - 01/22/20 0856      Sensation   Additional Comments medial and lateral sole decreased on L preTx,  equal sensation post Tx       Strength   Overall Strength Comments L DF/EV with numbness ( post Tx: no numbness)  Strength 4/5     hip ext/ abd B 3+/5       Palpation    SI assessment  hypomobile base of L5/S1 L with tenderness ( Post Tx: decreased)                          [facility] Adult PT Treatment/Exercise - 01/22/20 0858      Therapeutic Activites    Therapeutic Activities Other Therapeutic Activities    Other Therapeutic Activities log rolling, bathing baby, bottle baby with better mechanics       Neuro Re-ed    Neuro Re-ed Details  cued for techniques for better mechanics       Manual Therapy   Manual therapy comments AP mob L5/S1 at L base of SIJ, STM/MWM / rotational mob to promote nutation of L sacrum                         PT Long Term Goals - 12/18/19 6433      PT LONG TERM GOAL #1   Title Pt will demo improved SIJ mobility, levelled pelvic girdle, no deviation of coccyx, more nutation of sacrum  across 2 weeks in order to ambulate with less foot pain    Baseline R iliac crest higher , L SIJ hypomobile,    Time 2    Period Weeks    Status New    Target Date 01/01/20      PT LONG TERM GOAL #2   Title Pt will demo bilateral BLE sensation in order to minimize antalgic gait and return to walking and fitness    Baseline L  L 3-5S1 , thigh, medial leg, all dorsum/ plantar of foot    Time 6    Period Weeks    Status New    Target Date 01/29/20      PT LONG TERM GOAL #3   Title Pt will demo no adduction/ hip IR of L leg in gait and more foot mobility in order to improve neural mobility and regain sensation for ambulation    Time 8    Period Weeks    Status New    Target Date 02/12/20      PT LONG TERM GOAL #4   Title Pt will increase her FOTO score for Foot pain from 29 pts to > 60pts in order to improve QOL    Time 10    Period Weeks    Status New    Target Date 02/26/20      PT LONG TERM GOAL #5   Title Pt will report being able to sleep on her side without L hip pain in order to restore quality of sleep to care for her baby    Time 6    Period Weeks    Status New    Target Date 01/29/20       Additional Long Term Goals   Additional Long Term Goals --      PT LONG TERM GOAL #6   Title Pt will report decreaased straining with bowel movements from 50% of the time to no straining 100% of the time in order to promote pelvic floor function and minimize pelvic disorders    Time 8    Period Weeks    Status New    Target Date 02/12/20      PT LONG TERM GOAL #7   Title Pt will demo decreased perienal scar restrictions, optimial pelvic floor length and coordination to minimize pain during sexual intercourse.    Time 10    Period Weeks    Status New    Target Date 02/26/20                 Plan - 01/22/20 8563    Clinical Impression Statement Pt 's pain is decreasing with pt being able to return to shop and walk without significantly less pain. Sensation at L leg/ foot has returned and following manual Tx today at L SIJ to promote nutation of sacrum, pt regained equal sensation at medial and lateral soles and achieved no numbness sensation with DF/EV. Pt was educated on proper mechanics with several functional activities to minimize pain. Plan to add multifidis/ PF  strengthening at next session. Added thoracolumbar and glut strengthening because this quadriped position can be performed while giving attention to her baby. Provided excessive cues for wider BOS and feet propioception to promote more pelvic girdle stability.  Pt continues to benefit form skilled PT.    Examination-Activity Limitations Locomotion Level;Toileting    Stability/Clinical Decision Making Evolving/Moderate complexity    Rehab Potential Good    PT Frequency -- -  10  PT Treatment/Interventions Moist Heat;Therapeutic activities;Patient/family education;Therapeutic exercise;Balance training;Manual techniques;Neuromuscular re-education;Scar mobilization;Taping    Consulted and Agree with Plan of Care Patient           Patient will benefit from skilled therapeutic intervention in order to improve the  following deficits and impairments:  Decreased endurance, Decreased coordination, Abnormal gait, Decreased balance, Decreased mobility, Decreased strength, Postural dysfunction, Increased fascial restricitons, Increased muscle spasms, Pain, Improper body mechanics, Hypomobility, Decreased scar mobility, Decreased safety awareness  Visit Diagnosis: Pain in left hip  Other abnormalities of gait and mobility  Pain in left foot  Sacrococcygeal disorders, not elsewhere classified     Problem List Patient Active Problem List   Diagnosis Date Noted  . Post-term pregnancy, 40-42 weeks of gestation 10/24/2019  . Nausea/vomiting in pregnancy 09/24/2019  . Pregnancy 09/24/2019  . Decreased fetal movement 06/01/2019  . Supervision of other normal pregnancy, antepartum 03/01/2019  . GAD (generalized anxiety disorder) 12/04/2018  . Cervical radiculopathy 07/13/2016  . GERD without esophagitis 07/09/2015  . Migraine without aura and without status migrainosus, not intractable 07/09/2015    Mariane Masters ,PT, DPT, E-RYT  01/22/2020, 9:03 AM  [place] [facility] MAIN [facility] SERVICES [address] [place], [place], [address] Phone: [phone]   Fax:  [phone]  Name: Katherine Graham MRN: [id number] Date of Birth: 12-03-1985

## 2020-01-22 NOTE — Patient Instructions (Addendum)
      Use 3 ring binder with laptop  Use key pad and mouse attachments    ___   Bottle feed with pillows under both arms to make sure elbows and shoulders hang relaxed to minimize neck tightness  ___ Use the 45 deg ski track stance and bend with knees when putting him in stroller, changing diapers,   ___  sit on stool when bathing him in the tub to minimize back pain   _sidesleeping instead of belly,   we will keep working on back sleeping   __________ Strengthening: 2x day   Birddog   Table top position,  6 points of contact: paw hands, knees hip width apart, toes tucked under  Shoulders down and back like squeezing armpits  Not sagging back   L arm up , thumbs up, arm is out like a half"V" shoulder blade slides down and back  R knee straight, toes tucked on the ground,  Lengthen whole spine as if yard stick is balanced on spine, chin tucked   L + R = 1 rep 10 reps   X 2     3- foot tap  10 reps  Each side   Hold onto wall   Slightly bend of standing knee, and keep hips above foot   ballmound of opposite leg   taps to each direction and   back to spot under hips- notice equal pressure through both legs, and across ballmound and heels    ___    Avoid straining pelvic floor, abdominal muscles , spine  Use log rolling technique instead of getting out of bed with your neck or the sit-up     Log rolling into and out of bed   Log rolling into and out of bed If getting out of bed on R side, Bent knees, scoot hips/ shoulder to L  Raise R arm completely overhead, rolling onto armpit  Then lower bent knees to bed to get into complete side lying position  Then drop legs off bed, and push up onto R elbow/forearm, and use L hand to push onto the bed

## 2020-01-28 ENCOUNTER — Encounter: Payer: No Typology Code available for payment source | Admitting: Physical Therapy

## 2020-01-30 ENCOUNTER — Ambulatory Visit: Payer: No Typology Code available for payment source | Admitting: Physical Therapy

## 2020-02-02 ENCOUNTER — Other Ambulatory Visit: Payer: Self-pay | Admitting: Internal Medicine

## 2020-02-04 ENCOUNTER — Other Ambulatory Visit: Payer: Self-pay

## 2020-02-04 ENCOUNTER — Ambulatory Visit: Payer: No Typology Code available for payment source | Admitting: Physical Therapy

## 2020-02-04 DIAGNOSIS — M25552 Pain in left hip: Secondary | ICD-10-CM | POA: Diagnosis not present

## 2020-02-04 DIAGNOSIS — M79672 Pain in left foot: Secondary | ICD-10-CM

## 2020-02-04 DIAGNOSIS — R2689 Other abnormalities of gait and mobility: Secondary | ICD-10-CM

## 2020-02-04 DIAGNOSIS — M533 Sacrococcygeal disorders, not elsewhere classified: Secondary | ICD-10-CM

## 2020-02-04 NOTE — Patient Instructions (Addendum)
.  deep core level 1 and 2 ( handout)   Deep Core level 1 and 2     __ Midback strengthening   Lying on back, knees bent    band under ballmounds  while laying on back w/ knees bent  "W" exercise  10 reps x 2 sets   Band is placed under feet, knees bent, feet are hip width apart Hold band with thumbs point out, keep upper arm and elbow touching the bed the whole time  - inhale and then exhale pull bands by bending elbows hands move in a "w"  (feel shoulder blades squeezing)   __    Letter T, [place]aw on one leg with band band under L foot, wrap by big toe then, outer knee/ thigh, L hand pulling ( elbow by side)  Plant ballmound, toes spread, thigh out against band,, tracking knee in line with 2-3rd toe line Dippi ng forward, R foot lifts slight ( whole body like a see saw) off floor or  Press back foot against wall  15  reps  Left     Complimentary stretches:  seated figure 4   seated twist   __  Catch yourself not locking knees  Refocus on ballmounds and knees bent,    Practice walking with higher knees, with more ankle dorsiflexion and wider feet under hips

## 2020-02-04 NOTE — Therapy (Signed)
[place] [facility] SERVICES [address] [place], [place], [address] Phone: [phone]   Fax:  [phone]  Physical Therapy Treatment  Patient Details  Name: Katherine Graham MRN: [id number] Date of Birth: 26-Aug-1985 Referring Provider (PT): Drinda Butts, CNM   Encounter Date: 02/04/2020   PT End of Session - 02/04/20 0807    Visit Number 6    Number of Visits 10    Date for PT Re-Evaluation 02/26/20    PT Start Time 0803    PT Stop Time 0912    PT Time Calculation (min) 69 min           Past Medical History:  Diagnosis Date  . Hypothyroidism     Past Surgical History:  Procedure Laterality Date  . INDUCED ABORTION     Age 34  . TONSILLECTOMY      There were no vitals filed for this visit.   Subjective Assessment - 02/04/20 0806    Subjective Pt reported she had zero pain in her foot.   The birddog exercises causes burning in her top shoulders. She is feeling more pain in her low and midback and shoulders.    Pertinent History Hx of a Fx of her tailbone at the age of 34 without treatment. It caused alot of irritation when she was pregnant and she had to sit on a coccyx pillow at Week 20-41. Hx of sprained ankles as a child. Physical routine prior pregnancy: stairclimber, leg workouts : squats, Elliptical. Pt is able to walk on her elliptical during this time without increase in pain.  20 min.  Pt has a stretch rotuine after.  Denied SUI.    Patient Stated Goals foot and perineal scar to stop hurting.              [facility] PT Assessment - 02/04/20 0914      Sensation   Light Touch --   decreased LL3 medial shin, L5/S1 lateral calf/ lateral foot      Palpation   SI assessment  hypomobile tenderness at L SIJ , lacking nutation , mobility     Palpation comment tightness  ITband, glut med, lateral compartment of L leg,                         [facility] Adult PT Treatment/Exercise - 02/04/20 0913      Neuro Re-ed     Neuro Re-ed Details  cued for eversion and knee abduction in standing exercise, gait training with increased hip  flexion and more DF on push off       Manual Therapy   Manual therapy comments rotational mob at L SIJ , STM/MWM along ITband, glut med, lateral compartment of L leg, to minimize tensions                        PT Long Term Goals - 02/04/20 6734      PT LONG TERM GOAL #1   Title Pt will demo improved SIJ mobility, levelled pelvic girdle, no deviation of coccyx, more nutation of sacrum  across 2 weeks in order to ambulate with less foot pain    Baseline R iliac crest higher , L SIJ hypomobile,    Time 2    Period Weeks    Status Achieved      PT LONG TERM GOAL #2   Title Pt will demo bilateral BLE sensation in order  to minimize antalgic gait and return to walking and fitness    Baseline L  L 3-5S1 , thigh, medial leg, all dorsum/ plantar of foot    Time 6    Period Weeks    Status Achieved      PT LONG TERM GOAL #3   Title Pt will demo no adduction/ hip IR of L leg in gait and more foot mobility in order to improve neural mobility and regain sensation for ambulation    Time 8    Period Weeks    Status On-going      PT LONG TERM GOAL #4   Title Pt will increase her FOTO score for Foot pain from 29 pts to > 60pts in order to improve QOL    Time 10    Period Weeks    Status New      PT LONG TERM GOAL #5   Title Pt will report being able to sleep on her side without L hip pain in order to restore quality of sleep to care for her baby    Time 6    Period Weeks    Status Achieved      PT LONG TERM GOAL #6   Title Pt will report decreaased straining with bowel movements from 50% of the time to no straining 100% of the time in order to promote pelvic floor function and minimize pelvic disorders  ( 9/29: 10% of the time)    Time 8    Period Weeks    Status Partially Met      PT LONG TERM GOAL #7   Title Pt will demo decreased perienal scar  restrictions, optimial pelvic floor length and coordination to minimize pain during sexual intercourse.    Time 10    Period Weeks    Status On-going                 Plan - 02/04/20 4854    Clinical Impression Statement Pt reports no pain at foot but still showed mm tensions along lateral leg , hypomobility at L SIJ, and limited DF, and gait deviations w/ supination and limited hip /knee flexion. Pt tolerated manual Tx with modifications to more gentler techniques to address those deficits. Pt was progressed to [facility] propioceptive training to promote eversion of foot and knee abduction. P Also added scapular/ cervical retraction / depression strengthening to address her report of midback pain and added deep core exercsies to address low back pain.  Pt continues to benefit from skilled PT . Add multifidis at next session for further back and pelvic strengthening.    Examination-Activity Limitations Locomotion Level;Toileting    Stability/Clinical Decision Making Evolving/Moderate complexity    Rehab Potential Good    PT Frequency --   10   PT Treatment/Interventions Moist Heat;Therapeutic activities;Patient/family education;Therapeutic exercise;Balance training;Manual techniques;Neuromuscular re-education;Scar mobilization;Taping    Consulted and Agree with Plan of Care Patient           Patient will benefit from skilled therapeutic intervention in order to improve the following deficits and impairments:  Decreased endurance, Decreased coordination, Abnormal gait, Decreased balance, Decreased mobility, Decreased strength, Postural dysfunction, Increased fascial restricitons, Increased muscle spasms, Pain, Improper body mechanics, Hypomobility, Decreased scar mobility, Decreased safety awareness  Visit Diagnosis: Pain in left hip  Other abnormalities of gait and mobility  Pain in left foot  Sacrococcygeal disorders, not elsewhere classified     Problem List Patient Active  Problem List   Diagnosis Date Noted  .  Post-term pregnancy, 40-42 weeks of gestation 10/24/2019  . Nausea/vomiting in pregnancy 09/24/2019  . Pregnancy 09/24/2019  . Decreased fetal movement 06/01/2019  . Supervision of other normal pregnancy, antepartum 03/01/2019  . GAD (generalized anxiety disorder) 12/04/2018  . Cervical radiculopathy 07/13/2016  . GERD without esophagitis 07/09/2015  . Migraine without aura and without status migrainosus, not intractable 07/09/2015    Jerl Mina ,PT, DPT, E-RYT  02/04/2020, 9:16 AM  [place] MAIN [facility] SERVICES [address] [place], [place], [address] Phone: [phone]   Fax:  [phone]  Name: SHUNDA RABADI MRN: [id number] Date of Birth: 26-Mar-1986

## 2020-02-10 ENCOUNTER — Encounter: Payer: No Typology Code available for payment source | Admitting: Physical Therapy

## 2020-02-11 ENCOUNTER — Ambulatory Visit: Payer: No Typology Code available for payment source | Admitting: Physical Therapy

## 2020-02-17 ENCOUNTER — Encounter: Payer: No Typology Code available for payment source | Admitting: Physical Therapy

## 2020-02-18 ENCOUNTER — Ambulatory Visit: Payer: No Typology Code available for payment source | Admitting: Physical Therapy

## 2020-02-24 ENCOUNTER — Encounter: Payer: No Typology Code available for payment source | Admitting: Physical Therapy

## 2020-02-25 ENCOUNTER — Encounter: Payer: No Typology Code available for payment source | Admitting: Physical Therapy

## 2020-02-27 ENCOUNTER — Encounter: Payer: No Typology Code available for payment source | Admitting: Physical Therapy

## 2020-03-02 ENCOUNTER — Encounter: Payer: No Typology Code available for payment source | Admitting: Physical Therapy

## 2020-03-03 ENCOUNTER — Encounter: Payer: No Typology Code available for payment source | Admitting: Physical Therapy

## 2020-03-09 ENCOUNTER — Encounter: Payer: No Typology Code available for payment source | Admitting: Physical Therapy

## 2020-03-10 ENCOUNTER — Encounter: Payer: No Typology Code available for payment source | Admitting: Physical Therapy

## 2020-03-16 ENCOUNTER — Encounter: Payer: No Typology Code available for payment source | Admitting: Physical Therapy

## 2020-03-31 ENCOUNTER — Encounter: Payer: No Typology Code available for payment source | Admitting: Physical Therapy

## 2020-04-07 ENCOUNTER — Other Ambulatory Visit: Payer: Self-pay | Admitting: Internal Medicine

## 2020-04-14 ENCOUNTER — Encounter: Payer: No Typology Code available for payment source | Admitting: Physical Therapy

## 2020-04-28 ENCOUNTER — Encounter: Payer: No Typology Code available for payment source | Admitting: Physical Therapy

## 2020-05-24 DIAGNOSIS — Z03818 Encounter for observation for suspected exposure to other biological agents ruled out: Secondary | ICD-10-CM | POA: Diagnosis not present

## 2020-05-24 DIAGNOSIS — Z20822 Contact with and (suspected) exposure to covid-19: Secondary | ICD-10-CM | POA: Diagnosis not present

## 2020-05-31 ENCOUNTER — Other Ambulatory Visit: Payer: Self-pay | Admitting: Physician Assistant

## 2020-05-31 DIAGNOSIS — J208 Acute bronchitis due to other specified organisms: Secondary | ICD-10-CM | POA: Diagnosis not present

## 2020-05-31 DIAGNOSIS — U071 COVID-19: Secondary | ICD-10-CM | POA: Diagnosis not present

## 2020-06-04 ENCOUNTER — Other Ambulatory Visit: Payer: Self-pay | Admitting: Internal Medicine

## 2020-12-12 DIAGNOSIS — Z3493 Encounter for supervision of normal pregnancy, unspecified, third trimester: Secondary | ICD-10-CM | POA: Insufficient documentation

## 2021-01-14 LAB — OB RESULTS CONSOLE RUBELLA ANTIBODY, IGM: Rubella: IMMUNE

## 2021-01-14 LAB — OB RESULTS CONSOLE VARICELLA ZOSTER ANTIBODY, IGG: Varicella: IMMUNE

## 2021-01-14 LAB — OB RESULTS CONSOLE HEPATITIS B SURFACE ANTIGEN: Hepatitis B Surface Ag: NEGATIVE

## 2021-01-29 ENCOUNTER — Emergency Department
Admission: EM | Admit: 2021-01-29 | Discharge: 2021-01-29 | Disposition: A | Discharge status: Home / Self Care | Payer: No Typology Code available for payment source | Attending: Emergency Medicine | Admitting: Emergency Medicine

## 2021-01-29 ENCOUNTER — Other Ambulatory Visit: Payer: Self-pay

## 2021-01-29 ENCOUNTER — Emergency Department: Payer: No Typology Code available for payment source

## 2021-01-29 DIAGNOSIS — O4690 Antepartum hemorrhage, unspecified, unspecified trimester: Secondary | ICD-10-CM

## 2021-01-29 DIAGNOSIS — O208 Other hemorrhage in early pregnancy: Secondary | ICD-10-CM | POA: Insufficient documentation

## 2021-01-29 DIAGNOSIS — Z3A14 14 weeks gestation of pregnancy: Secondary | ICD-10-CM | POA: Insufficient documentation

## 2021-01-29 DIAGNOSIS — E039 Hypothyroidism, unspecified: Secondary | ICD-10-CM | POA: Insufficient documentation

## 2021-01-29 DIAGNOSIS — N939 Abnormal uterine and vaginal bleeding, unspecified: Secondary | ICD-10-CM

## 2021-01-29 LAB — BASIC METABOLIC PANEL
Anion gap: 6 (ref 5–15)
BUN: 8 mg/dL (ref 6–20)
CO2: 24 mmol/L (ref 22–32)
Calcium: 8.9 mg/dL (ref 8.9–10.3)
Chloride: 105 mmol/L (ref 98–111)
Creatinine, Ser: 0.41 mg/dL — ABNORMAL LOW (ref 0.44–1.00)
GFR, Estimated: >60 mL/min (ref >60)
Glucose, Bld: 84 mg/dL (ref 70–99)
Potassium: 3.6 mmol/L (ref 3.5–5.1)
Sodium: 135 mmol/L (ref 135–145)

## 2021-01-29 LAB — URINALYSIS, ROUTINE W REFLEX MICROSCOPIC
Bacteria, UA: NONE SEEN
Bilirubin Urine: NEGATIVE
Glucose, UA: NEGATIVE mg/dL
Ketones, ur: 5 mg/dL — AB
Leukocytes,Ua: NEGATIVE
Nitrite: NEGATIVE
Protein, ur: NEGATIVE mg/dL
Specific Gravity, Urine: 1.005 (ref 1.005–1.030)
pH: 7 (ref 5.0–8.0)

## 2021-01-29 LAB — CBC
HCT: 37.2 % (ref 36.0–46.0)
Hemoglobin: 12.8 g/dL (ref 12.0–15.0)
MCH: 32.5 pg (ref 26.0–34.0)
MCHC: 34.4 g/dL (ref 30.0–36.0)
MCV: 94.4 fL (ref 80.0–100.0)
Platelets: 241 10*3/uL (ref 150–400)
RBC: 3.94 MIL/uL (ref 3.87–5.11)
RDW: 12.4 % (ref 11.5–15.5)
WBC: 8.3 10*3/uL (ref 4.0–10.5)
nRBC: 0 % (ref 0.0–0.2)

## 2021-01-29 LAB — ABO/RH: ABO/RH(D): O POS

## 2021-01-29 LAB — HCG, QUANTITATIVE, PREGNANCY: hCG, Beta Chain, Quant, S: 71054 m[IU]/mL — ABNORMAL HIGH (ref <5)

## 2021-01-29 LAB — POC URINE PREG, ED: Preg Test, Ur: POSITIVE — AB

## 2021-01-29 NOTE — ED Notes (Signed)
RN to pt room to discharge, pt not present in ED room.

## 2021-01-29 NOTE — ED Provider Notes (Signed)
[facility] Emergency Department Provider Note  ____________________________________________   None    (approximate)  I have reviewed the triage vital signs and the nursing notes.   HISTORY  Chief Complaint Vaginal Bleeding    HPI Katherine Graham is a 35 y.o. female  who is currently  14 weeks and 2 days pregnant who comes in with bleeding started today, having period like menstruation, bright red blood with some lower abdominal cramp associated with it. Follows with [facility]. Prior US all looked good.  Called her OB/GYN who told her to come in to be evaluated.  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 One abortion.  Had hemorrhage after birth had 2 blood transfusion.           Past Medical History:  Diagnosis Date   Hypothyroidism     Patient Active Problem List   Diagnosis Date Noted   Post-term pregnancy, 40-42 weeks of gestation 10/24/2019   Nausea/vomiting in pregnancy 09/24/2019   Pregnancy 09/24/2019   Decreased fetal movement 06/01/2019   Supervision of other normal pregnancy, antepartum 03/01/2019   GAD (generalized anxiety disorder) 12/04/2018   Cervical radiculopathy 07/13/2016   GERD without esophagitis 07/09/2015   Migraine without aura and without status migrainosus, not intractable 07/09/2015    Past Surgical History:  Procedure Laterality Date   INDUCED ABORTION     Age 89   TONSILLECTOMY      Prior to Admission medications   Medication Sig Start Date End Date Taking? Authorizing Provider  acetaminophen (TYLENOL) 325 MG tablet Take 2 tablets (650 mg total) by mouth every 6 (six) hours as needed (for pain scale < 4). 10/27/19   Haroldine Laws, CNM  amoxicillin (AMOXIL) 875 MG tablet TAKE 1 TABLET BY MOUTH 2 TIMES DAILY FOR 10 DAYS 06/04/20 06/04/21  Marguarite Arbour, MD  azithromycin (ZITHROMAX) 250 MG tablet TAKE 2 TABLETS BY MOUTH ON DAY 1 THEN TAKE 1 TABLET DAILY FOR THE NEXT 4 DAYS  05/31/20 05/31/21  Ignacia Bayley, PA-C  cyclobenzaprine (FLEXERIL) 10 MG tablet TAKE 1 TABLET BY MOUTH NIGHTLY FOR 10 DAYS 04/07/20 04/07/21  Marguarite Arbour, MD  ferrous sulfate 325 (65 FE) MG tablet Take 1 tablet (325 mg total) by mouth 2 (two) times daily with a meal. 10/27/19   Haroldine Laws, CNM  gabapentin (NEURONTIN) 100 MG capsule Take 1 capsule (100 mg total) by mouth 3 (three) times daily for 14 days. 10/27/19 11/10/19  Haroldine Laws, CNM  ibuprofen (ADVIL) 600 MG tablet Take 1 tablet (600 mg total) by mouth every 6 (six) hours as needed. 10/27/19   Haroldine Laws, CNM  levothyroxine (SYNTHROID) 112 MCG tablet Take 1 tablet (112 mcg total) by mouth daily at 6 (six) AM. 10/28/19 12/27/19  Haroldine Laws, CNM  levothyroxine (SYNTHROID) 112 MCG tablet TAKE 1 TABLET BY MOUTH ONCE DAILY 3O TO 60 MINUTES BEFORE BREAKFAST ON AN EMPTY STOMACH WITH A FULL GLASS OF WATER 05/29/19 05/28/20  Otelia Sergeant, NP  loratadine (CLARITIN) 10 MG tablet Take 10 mg by mouth daily.    [provider]  magnesium oxide (MAG-OX) 400 MG tablet Take 400 mg by mouth daily.    [provider]  predniSONE (DELTASONE) 10 MG tablet TAKE 4 TABLETS BY MOUTH DAILY FOR 3 DAYS, 3 TABS FOR 3 DAYS, 2 TABS FOR 3 DAYS, THEN 1 TAB FOR 3 DAYS 05/31/20 05/31/21  Ignacia Bayley, PA-C  predniSONE (DELTASONE) 10 MG  tablet TAKE 6 TABLETS BY MOUTH FOR 2 DAYS, 4 TABLETS FOR 2 DAYS, 2 TABLETS FOR 2 DAYS, THEN 1 TABLET FOR 2 DAYS 04/07/20 04/07/21  Marguarite Arbour, MD  Prenatal Vit-Fe Fumarate-FA (PRENATAL VITAMIN AND MINERAL PO)  08/22/18   [provider]  Probiotic Product (PROBIOTIC-10 ULTIMATE) CAPS Take 1 capsule by mouth daily.    [provider]  pseudoephedrine-guaifenesin (MUCINEX D) 60-600 MG 12 hr tablet TAKE 1 TABLET BY MOUTH EVERY 12 HOURS FOR 5 DAYS 06/04/20 06/04/21  Marguarite Arbour, MD  tiZANidine (ZANAFLEX) 4 MG tablet TAKE 1 TABLET (4 MG TOTAL) BY MOUTH EVERY 8 (EIGHT) HOURS AS NEEDED 02/02/20  02/01/21  Marguarite Arbour, MD  vitamin B-12 (CYANOCOBALAMIN) 500 MCG tablet Take 500 mcg by mouth daily.    [provider]    Allergies Patient has no known allergies.  Family History  Problem Relation Age of Onset   Colon cancer Maternal Grandmother 58    Social History Social History   Tobacco Use   Smoking status: Never   Smokeless tobacco: Never  Vaping Use   Vaping Use: Never used  Substance Use Topics   Alcohol use: Not Currently   Drug use: No      Review of Systems Constitutional: No fever/chills Eyes: No visual changes. ENT: No sore throat. Cardiovascular: Denies chest pain. Respiratory: Denies shortness of breath. Gastrointestinal: + abd cramping  Genitourinary: Negative for dysuria. Vaginal bleeding  Musculoskeletal: Negative for back pain. Skin: Negative for rash. Neurological: Negative for headaches, focal weakness or numbness. All other ROS negative ____________________________________________   PHYSICAL EXAM:  VITAL SIGNS: ED Triage Vitals  Enc Vitals Group     BP 01/29/21 0817 112/70     Pulse Rate 01/29/21 0817 82     Resp 01/29/21 0817 18     Temp 01/29/21 0817 98 F (36.7 C)     Temp Source 01/29/21 0817 Oral     SpO2 01/29/21 0817 100 %     Weight --      Height --      Head Circumference --      Peak Flow --      Pain Score 01/29/21 0758 5     Pain Loc --      Pain Edu? --      Excl. in GC? --     Constitutional: Alert and oriented. Well appearing and in no acute distress. Eyes: Conjunctivae are normal. EOMI. Head: Atraumatic. Nose: No congestion/rhinnorhea. Mouth/Throat: Mucous membranes are moist.   Neck: No stridor. Trachea Midline. FROM Cardiovascular: Normal rate, regular rhythm. Grossly normal heart sounds.  Good peripheral circulation. Respiratory: Normal respiratory effort.  No retractions. Lungs CTAB. Gastrointestinal: Soft and nontender. No distention. No abdominal bruits.  Musculoskeletal: No lower  extremity tenderness nor edema.  No joint effusions. Neurologic:  Normal speech and language. No gross focal neurologic deficits are appreciated.  Skin:  Skin is warm, dry and intact. No rash noted. Psychiatric: Mood and affect are normal. Speech and behavior are normal. GU: Some dark blood noted with a closed cervix  ____________________________________________   LABS (all labs ordered are listed, but only abnormal results are displayed)  Labs Reviewed  HCG, QUANTITATIVE, PREGNANCY - Abnormal; Notable for the following components:      Result Value   hCG, Beta Chain, Quant, S 71,054 (*)    All other components within normal limits  POC URINE PREG, ED - Abnormal; Notable for the following components:   Preg  Test, Ur Positive (*)    All other components within normal limits  CBC   ____________________________________________   RADIOLOGY  Official radiology report(s): US OB Limited  Result Date: 01/29/2021 CLINICAL DATA:  Vaginal bleeding. EXAM: LIMITED OBSTETRIC ULTRASOUND COMPARISON:  none FINDINGS: Number of Fetuses: 1 Heart Rate:  141 bpm Movement: Yes Presentation: Cephalic Placental Location: Anterior Previa: No, distance to internal cervical os: 4.1 cm Amniotic Fluid (Subjective):  Within normal limits. AFI: Not requested Femur length: 1.4 cm   14 w  2 d MATERNAL FINDINGS: Cervix:  Appears closed.  Measures 3.3 cm. Uterus/Adnexae: No abnormality visualized. IMPRESSION: Single living intrauterine gestation with an estimated gestational age of [redacted] weeks and 2 days. No complications identified. Electronically Signed   By: Signa Kell M.D.   On: 01/29/2021 12:38    ____________________________________________   PROCEDURES  Procedure(s) performed (including Critical Care):  Procedures   ____________________________________________   INITIAL IMPRESSION / ASSESSMENT AND PLAN / ED COURSE  TALI CLEAVES was evaluated in Emergency Department on 01/29/2021 for the symptoms  described in the history of present illness. She was evaluated in the context of the global COVID-19 pandemic, which necessitated consideration that the patient might be at risk for infection with the SARS-CoV-2 virus that causes COVID-19. Institutional protocols and algorithms that pertain to the evaluation of patients at risk for COVID-19 are in a state of rapid change based on information released by regulatory bodies including the CDC and federal and state organizations. These policies and algorithms were followed during the patient's care in the ED.    Patient comes in with vaginal bleeding in the setting of second trimester pregnancy.  Will get ultrasound to evaluate for fetal heart rate.  Low suspicion for ectopic given prior ultrasounds have been reassuring per patient.  Labs ordered evaluate for any anemia.  Rh ordered to make sure no evidence of needing RhoGAM    Ultrasound reassuring shows live pregnancy UA without evidence of UTI.  Labs are otherwise reassuring with no anemia patient is O+  Will discuss with the OB/GYN team to make sure nothing special to do given isoimmuniztion.   Discussed with OB/GYN Rubye Oaks who talked with Dr. Dalbert Garnet nothing else that needs to be done acutely here in the emergency room due to Kell isoimmunization but she is to follow-up with MFM. Nothing else to do in ER   Patient states that she already has an appointment in the beginning of October.  Told her to call her OB/GYN team on Monday to let them know what was going on.  She expressed understanding and will follow up with OB outpatient         ____________________________________________   FINAL CLINICAL IMPRESSION(S) / ED DIAGNOSES   Final diagnoses:  Vaginal bleeding in pregnancy      MEDICATIONS GIVEN DURING THIS VISIT:  Medications - No data to display   ED Discharge Orders     None        Note:  This document was prepared using Dragon voice recognition software and may  include unintentional dictation errors.    Concha Se, MD 01/29/21 1450

## 2021-01-29 NOTE — Progress Notes (Signed)
Spoke with Dr Fuller Plan in the ED  after consulting my supervising physician,concerning vaginal bleeding  For Katherine Graham at [redacted] weeks gestation with Kell Isoimmunization. Patient has O pos blood type. Patient to f/u with MFM, referral was placed 01/21/21 by [facility] US done today shows viable IUP, no evidence of placenta previa, fetus measuring appropriately at 14.2 weeks. No active bleeding at this time.  Chari Manning CNM

## 2021-01-29 NOTE — ED Triage Notes (Signed)
Pt come with c/o vaginal bleeding that started this am. Pt states about 30 minutes ago the cramping started. Pt states no clots and small amounts on an off.  Pt states she is about 14 weeks preg.

## 2021-01-29 NOTE — Discharge Instructions (Addendum)
Work-up was reassuring but you still need to follow-up with OB/GYN given your history please call them on Monday to let them know what is going on.  I did discuss with the [facility] doctor Dalbert Garnet and there was nothing acute to be done here in the emergency room but needs MFM follow up.   Return for worsening bleeding, worsening pain, fevers or any other concern   Number of Fetuses: 1   Heart Rate:  141 bpm   Movement: Yes   Presentation: Cephalic   Placental Location: Anterior   Previa: No, distance to internal cervical os: 4.1 cm   Amniotic Fluid (Subjective):  Within normal limits.   AFI: Not requested   Femur length: 1.4 cm   14 w  2 d   MATERNAL FINDINGS:   Cervix:  Appears closed.  Measures 3.3 cm.   Uterus/Adnexae: No abnormality visualized.   IMPRESSION: Single living intrauterine gestation with an estimated gestational age of [redacted] weeks and 2 days. No complications identified.

## 2021-02-08 DIAGNOSIS — O361930 Maternal care for other isoimmunization, third trimester, not applicable or unspecified: Secondary | ICD-10-CM | POA: Insufficient documentation

## 2021-02-08 DIAGNOSIS — O361120 Maternal care for Anti-A sensitization, second trimester, not applicable or unspecified: Secondary | ICD-10-CM | POA: Insufficient documentation

## 2021-04-18 DIAGNOSIS — O09299 Supervision of pregnancy with other poor reproductive or obstetric history, unspecified trimester: Secondary | ICD-10-CM | POA: Insufficient documentation

## 2021-04-18 DIAGNOSIS — O09293 Supervision of pregnancy with other poor reproductive or obstetric history, third trimester: Secondary | ICD-10-CM | POA: Insufficient documentation

## 2021-05-08 NOTE — L&D Delivery Note (Signed)
Delivery Note ? ?Katherine Graham is a [id number] at [redacted]w[redacted]d with an LMP of 10/15/2020, not consistent with [place] at [redacted]w[redacted]d.  ? ?First Stage: ?Labor onset: 0830 ?Augmentation:  None ?Analgesia /Anesthesia intrapartum: None ?SROM at 1311 ?GBS: Negative  ? ?Second Stage: ?Complete dilation at 1308 ?Onset of pushing at 138 ?FHR second stage: 135bpm by doppler, no decels auscultated  ? ?Katherine Graham presented to L&D in active labor.  She was expectantly managed and progressed well to C/C/+2 with a strong urge to push.  She pushed over approximately 4 minutes for a quick spontaneous birth standing at the bedside.   ?Delivery of a viable baby boy on 07/26/2021 at 1312 by CNM ?Delivery of fetal head in OA position with restitution to ROT. ?No nuchal cord;  Anterior then posterior shoulders delivered easily with gentle downward traction. Baby handed to mom and assisted with holding skin to skin.  Katherine Graham then assisted to the bed while baby remained skin to skin.  Infant was attended to baby Ambulance person.  ?Cord double clamped after cessation of pulsation, cut by father of baby. ? ?Cord blood sample collection: Yes O POS ? ?Third Stage: ?Oxytocin IM injection given after delivery of infant for hemorrhage prophylaxis  ?Placenta delivered intact with 3 VC @ 1326 ?Placenta disposition: discarded  ?Uterine tone firm/ bleeding small to moderate  ? ?1st vaginal laceration identified, hemostatic, no repair needed  ?Anesthesia for repair: N/A ?Repair: N/A  ?Est. Blood Loss (mL):  ? ?Complications: None ? ?Mom to postpartum.  Baby to Couplet care / Skin to Skin. ? ?Newborn: ?Information for the patient's newborn:  Katherine Graham, Katherine Graham [[id number]]  ?Live born female "Katherine Graham" ?Birth Weight: 8 lb 7.5 oz (3840 g) ?APGAR: 7, 9 ? ?Newborn Delivery   ?Birth date/time: 07/26/2020 13:12:00 ?Delivery type: Vaginal, Spontaneous ?  ?  ?Feeding planned: Breast  ? ?---------- ?Margaretmary Eddy, CNM ?Certified Nurse Midwife ?[facility] ?[facility]  ? ?

## 2021-07-01 LAB — OB RESULTS CONSOLE HIV ANTIBODY (ROUTINE TESTING): HIV: NONREACTIVE

## 2021-07-01 LAB — OB RESULTS CONSOLE GBS: GBS: NEGATIVE

## 2021-07-01 LAB — OB RESULTS CONSOLE GC/CHLAMYDIA
Chlamydia: NEGATIVE
Gonorrhea: NEGATIVE

## 2021-07-01 LAB — OB RESULTS CONSOLE RPR: RPR: NONREACTIVE

## 2021-07-26 ENCOUNTER — Inpatient Hospital Stay
Admission: EM | Admit: 2021-07-26 | Discharge: 2021-07-27 | DRG: 807 | Disposition: A | Discharge status: Home / Self Care | Payer: Self-pay

## 2021-07-26 ENCOUNTER — Other Ambulatory Visit: Payer: Self-pay

## 2021-07-26 ENCOUNTER — Encounter: Payer: Self-pay | Admitting: Obstetrics and Gynecology

## 2021-07-26 DIAGNOSIS — O99284 Endocrine, nutritional and metabolic diseases complicating childbirth: Secondary | ICD-10-CM | POA: Diagnosis present

## 2021-07-26 DIAGNOSIS — O9902 Anemia complicating childbirth: Principal | ICD-10-CM | POA: Diagnosis present

## 2021-07-26 DIAGNOSIS — D509 Iron deficiency anemia, unspecified: Secondary | ICD-10-CM | POA: Diagnosis present

## 2021-07-26 DIAGNOSIS — E039 Hypothyroidism, unspecified: Secondary | ICD-10-CM | POA: Diagnosis present

## 2021-07-26 DIAGNOSIS — Z3A39 39 weeks gestation of pregnancy: Secondary | ICD-10-CM

## 2021-07-26 LAB — TYPE AND SCREEN
ABO/RH(D): O POS
Antibody Screen: NEGATIVE

## 2021-07-26 LAB — CBC
HCT: 37.4 % (ref 36.0–46.0)
Hemoglobin: 12 g/dL (ref 12.0–15.0)
MCH: 27 pg (ref 26.0–34.0)
MCHC: 32.1 g/dL (ref 30.0–36.0)
MCV: 84 fL (ref 80.0–100.0)
Platelets: 311 10*3/uL (ref 150–400)
RBC: 4.45 MIL/uL (ref 3.87–5.11)
RDW: 14.7 % (ref 11.5–15.5)
WBC: 13.9 10*3/uL — ABNORMAL HIGH (ref 4.0–10.5)
nRBC: 0 % (ref 0.0–0.2)

## 2021-07-26 LAB — RPR: RPR Ser Ql: NONREACTIVE

## 2021-07-26 MED ORDER — OXYTOCIN-SODIUM CHLORIDE 30-0.9 UT/500ML-% IV SOLN
INTRAVENOUS | Status: AC
  Filled 2021-07-26: qty 500

## 2021-07-26 MED ORDER — OXYTOCIN 10 UNIT/ML IJ SOLN
INTRAMUSCULAR | Status: AC
  Administered 2021-07-26: 10 [IU] via INTRAMUSCULAR
  Filled 2021-07-26: qty 1

## 2021-07-26 MED ORDER — FENTANYL CITRATE (PF) 100 MCG/2ML IJ SOLN: 50.0000 ug | INTRAMUSCULAR | Status: DC | PRN

## 2021-07-26 MED ORDER — IBUPROFEN 600 MG PO TABS
ORAL_TABLET | ORAL | Status: AC
  Filled 2021-07-26: qty 1

## 2021-07-26 MED ORDER — DIPHENHYDRAMINE HCL 25 MG PO CAPS: 25.0000 mg | ORAL_CAPSULE | Freq: Four times a day (QID) | ORAL | Status: DC | PRN

## 2021-07-26 MED ORDER — COCONUT OIL OIL: 1.0000 "application " | TOPICAL_OIL | Status: DC | PRN

## 2021-07-26 MED ORDER — SALINE SPRAY 0.65 % NA SOLN
1.0000 | NASAL | Status: DC | PRN
  Filled 2021-07-26: qty 44

## 2021-07-26 MED ORDER — LIDOCAINE HCL (PF) 1 % IJ SOLN
INTRAMUSCULAR | Status: AC
  Filled 2021-07-26: qty 30

## 2021-07-26 MED ORDER — ACETAMINOPHEN 500 MG PO TABS
1000.0000 mg | ORAL_TABLET | Freq: Four times a day (QID) | ORAL | Status: DC | PRN
  Administered 2021-07-26 – 2021-07-27 (×2): 1000 mg via ORAL
  Filled 2021-07-26 (×2): qty 2

## 2021-07-26 MED ORDER — SIMETHICONE 80 MG PO CHEW: 80.0000 mg | CHEWABLE_TABLET | ORAL | Status: DC | PRN

## 2021-07-26 MED ORDER — ACETAMINOPHEN 500 MG PO TABS: 1000.0000 mg | ORAL_TABLET | Freq: Four times a day (QID) | ORAL | Status: DC | PRN

## 2021-07-26 MED ORDER — OXYTOCIN 10 UNIT/ML IJ SOLN
10.0000 [IU] | Freq: Once | INTRAMUSCULAR | Status: AC | PRN
Start: 2021-07-26 — End: 2021-07-26

## 2021-07-26 MED ORDER — MISOPROSTOL 200 MCG PO TABS
ORAL_TABLET | ORAL | Status: AC
  Filled 2021-07-26: qty 4

## 2021-07-26 MED ORDER — LACTATED RINGERS IV SOLN: 500.0000 mL | INTRAVENOUS | Status: DC | PRN

## 2021-07-26 MED ORDER — ONDANSETRON HCL 4 MG/2ML IJ SOLN: 4.0000 mg | Freq: Four times a day (QID) | INTRAMUSCULAR | Status: DC | PRN

## 2021-07-26 MED ORDER — BENZOCAINE-MENTHOL 20-0.5 % EX AERO
1.0000 | INHALATION_SPRAY | CUTANEOUS | Status: DC | PRN
Start: 2021-07-26 — End: 2021-07-27
  Administered 2021-07-26: 1 via TOPICAL
  Filled 2021-07-26: qty 56

## 2021-07-26 MED ORDER — LIDOCAINE HCL (PF) 1 % IJ SOLN
30.0000 mL | INTRAMUSCULAR | Status: DC | PRN
Start: 2021-07-26 — End: 2021-07-26

## 2021-07-26 MED ORDER — OXYTOCIN-SODIUM CHLORIDE 30-0.9 UT/500ML-% IV SOLN: 2.5000 [IU]/h | INTRAVENOUS | Status: DC

## 2021-07-26 MED ORDER — LEVOTHYROXINE SODIUM 112 MCG PO TABS
112.0000 ug | ORAL_TABLET | Freq: Every day | ORAL | Status: DC
Start: 2021-07-27 — End: 2021-07-27
  Administered 2021-07-27: 112 ug via ORAL
  Filled 2021-07-26: qty 1

## 2021-07-26 MED ORDER — SOD CITRATE-CITRIC ACID 500-334 MG/5ML PO SOLN: 30.0000 mL | ORAL | Status: DC | PRN

## 2021-07-26 MED ORDER — DOCUSATE SODIUM 100 MG PO CAPS
100.0000 mg | ORAL_CAPSULE | Freq: Two times a day (BID) | ORAL | Status: DC
  Administered 2021-07-27: 100 mg via ORAL
  Filled 2021-07-26: qty 1

## 2021-07-26 MED ORDER — WITCH HAZEL-GLYCERIN EX PADS
1.0000 "application " | MEDICATED_PAD | CUTANEOUS | Status: DC
  Administered 2021-07-26: 1 via TOPICAL
  Filled 2021-07-26: qty 100

## 2021-07-26 MED ORDER — PRENATAL MULTIVITAMIN CH
1.0000 | ORAL_TABLET | Freq: Every day | ORAL | Status: DC
  Administered 2021-07-27: 1 via ORAL
  Filled 2021-07-26: qty 1

## 2021-07-26 MED ORDER — AMMONIA AROMATIC IN INHA
RESPIRATORY_TRACT | Status: AC
  Filled 2021-07-26: qty 10

## 2021-07-26 MED ORDER — IBUPROFEN 600 MG PO TABS
600.0000 mg | ORAL_TABLET | Freq: Four times a day (QID) | ORAL | Status: DC
  Administered 2021-07-26 – 2021-07-27 (×3): 600 mg via ORAL
  Filled 2021-07-26 (×2): qty 1

## 2021-07-26 MED ORDER — LACTATED RINGERS IV SOLN: INTRAVENOUS | Status: DC

## 2021-07-26 MED ORDER — ONDANSETRON HCL 4 MG PO TABS
4.0000 mg | ORAL_TABLET | ORAL | Status: DC | PRN
Start: 2021-07-26 — End: 2021-07-27

## 2021-07-26 MED ORDER — ONDANSETRON HCL 4 MG/2ML IJ SOLN: 4.0000 mg | INTRAMUSCULAR | Status: DC | PRN

## 2021-07-26 MED ORDER — DIBUCAINE (PERIANAL) 1 % EX OINT
1.0000 "application " | TOPICAL_OINTMENT | CUTANEOUS | Status: DC | PRN
  Administered 2021-07-26: 1 via RECTAL
  Filled 2021-07-26: qty 28

## 2021-07-26 MED ORDER — OXYTOCIN BOLUS FROM INFUSION
333.0000 mL | Freq: Once | INTRAVENOUS | Status: DC
Start: 2021-07-26 — End: 2021-07-26

## 2021-07-26 NOTE — Discharge Instructions (Addendum)
Discharge Instructions:   Follow-up Appointment:  If there are any new medications, they have been ordered and will be available for pickup at the listed pharmacy on your way home from the hospital.   Call office if you have any of the following: headache, visual changes, fever >101.0 F, chills, shortness of breath, breast concerns, excessive vaginal bleeding, incision drainage or problems, leg pain or redness, depression or any other concerns. If you have vaginal discharge with an odor, let your doctor know.   It is normal to bleed for up to 6 weeks. You should not soak through more than 1 pad in 1 hour. If you have a blood clot larger than your fist with continued bleeding, call your doctor.   Activity: Do not lift > 10 lbs for 6 weeks (do not lift anything heavier than your baby). No intercourse, tampons, swimming pools, hot tubs, baths (only showers) for 6 weeks.  No driving for 1-2 weeks. Continue prenatal vitamin, especially if breastfeeding. Increase calories and fluids (water) while breastfeeding.   Your milk will come in, in the next couple of days (right now it is colostrum). You may have a slight fever when your milk comes in, but it should go away on its own.  If it does not, and rises above 101 F please call the doctor. You will also feel achy and your breasts will be firm. They will also start to leak. If you are breastfeeding, continue as you have been and you can pump/express milk for comfort.   If you have too much milk, your breasts can become engorged, which could lead to mastitis. This is an infection of the milk ducts. It can be very painful and you will need to notify your doctor to obtain a prescription for antibiotics. You can also treat it with a shower or hot/cold compress.   For concerns about your baby, please call your pediatrician.  For breastfeeding concerns, the lactation consultant can be reached at [phone].   Postpartum blues (feelings of happy one minute  and sad another minute) are normal for the first few weeks but if it gets worse let your doctor know.   Congratulations! We enjoyed caring for you and your new bundle of joy!     Vaginal Delivery, Care After Refer to this sheet in the next few weeks. These discharge instructions provide you with information on caring for yourself after delivery. Your caregiver may also give you specific instructions. Your treatment has been planned according to the most current medical practices available, but problems sometimes occur. Call your caregiver if you have any problems or questions after you go home. HOME CARE INSTRUCTIONS Take over-the-counter or prescription medicines only as directed by your caregiver or pharmacist. Do not drink alcohol, especially if you are breastfeeding or taking medicine to relieve pain. Do not smoke tobacco. Continue to use good perineal care. Good perineal care includes: Wiping your perineum from back to front Keeping your perineum clean. You can do sitz baths twice a day, to help keep this area clean Do not use tampons, douche or have sex until your caregiver says it is okay. Shower only and avoid sitting in submerged water, aside from sitz baths Wear a well-fitting bra that provides breast support. Eat healthy foods. Drink enough fluids to keep your urine clear or pale yellow. Eat high-fiber foods such as whole grain cereals and breads, brown rice, beans, and fresh fruits and vegetables every day. These foods may help prevent or relieve constipation.   Avoid constipation with high fiber foods or medications, such as miralax or metamucil Follow your caregiver's recommendations regarding resumption of activities such as climbing stairs, driving, lifting, exercising, or traveling. Talk to your caregiver about resuming sexual activities. Resumption of sexual activities is dependent upon your risk of infection, your rate of healing, and your comfort and desire to resume sexual  activity. Try to have someone help you with your household activities and your newborn for at least a few days after you leave the hospital. Rest as much as possible. Try to rest or take a nap when your newborn is sleeping. Increase your activities gradually. Keep all of your scheduled postpartum appointments. It is very important to keep your scheduled follow-up appointments. At these appointments, your caregiver will be checking to make sure that you are healing physically and emotionally. SEEK MEDICAL CARE IF:  You are passing large clots from your vagina. Save any clots to show your caregiver. You have a foul smelling discharge from your vagina. You have trouble urinating. You are urinating frequently. You have pain when you urinate. You have a change in your bowel movements. You have increasing redness, pain, or swelling near your vaginal incision (episiotomy) or vaginal tear. You have pus draining from your episiotomy or vaginal tear. Your episiotomy or vaginal tear is separating. You have painful, hard, or reddened breasts. You have a severe headache. You have blurred vision or see spots. You feel sad or depressed. You have thoughts of hurting yourself or your newborn. You have questions about your care, the care of your newborn, or medicines. You are dizzy or light-headed. You have a rash. You have nausea or vomiting. You were breastfeeding and have not had a menstrual period within 12 weeks after you stopped breastfeeding. You are not breastfeeding and have not had a menstrual period by the 12th week after delivery. You have a fever. SEEK IMMEDIATE MEDICAL CARE IF:  You have persistent pain. You have chest pain. You have shortness of breath. You faint. You have leg pain. You have stomach pain. Your vaginal bleeding saturates two or more sanitary pads in 1 hour. MAKE SURE YOU:  Understand these instructions. Will watch your condition. Will get help right away if you are  not doing well or get worse. Document Released: 04/21/2000 Document Revised: 09/08/2013 Document Reviewed: 12/20/2011 ExitCare Patient Information 2015 [employer]. This information is not intended to replace advice given to you by your health care provider. Make sure you discuss any questions you have with your health care provider.  Sitz Bath A sitz bath is a warm water bath taken in the sitting position. The water covers only the hips and butt (buttocks). We recommend using one that fits in the toilet, to help with ease of use and cleanliness. It may be used for either healing or cleaning purposes. Sitz baths are also used to relieve pain, itching, or muscle tightening (spasms). The water may contain medicine. Moist heat will help you heal and relax.  HOME CARE  Take 3 to 4 sitz baths a day. Fill the bathtub half-full with warm water. Sit in the water and open the drain a little. Turn on the warm water to keep the tub half-full. Keep the water running constantly. Soak in the water for 15 to 20 minutes. After the sitz bath, pat the affected area dry. GET HELP RIGHT AWAY IF: You get worse instead of better. Stop the sitz baths if you get worse. MAKE SURE YOU: Understand these instructions.   Will watch your condition. Will get help right away if you are not doing well or get worse. Document Released: 06/01/2004 Document Revised: 01/17/2012 Document Reviewed: 08/22/2010 ExitCare Patient Information 2015 [employer]. This information is not intended to replace advice given to you by your health care provider. Make sure you discuss any questions you have with your health care provider.   

## 2021-07-26 NOTE — Discharge Summary (Signed)
Obstetrical Discharge Summary ? ?Patient Name: Katherine Graham ?DOB: June 25, 1985 ?MRN: [id number] ? ?Date of Admission: 07/26/2021 ?Date of Delivery: 07/26/2021 ?Delivered by: Margaretmary Eddy, CNM  ?Date of Discharge: 07/27/2021 ? ?Primary OB: [facility] ?LMP:No LMP recorded. ?EDC Estimated Date of Delivery: 07/28/21 ?Gestational Age at Delivery: [redacted]w[redacted]d  ? 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 ?Admitting Diagnosis: Normal labor [O80, Z37.9]  ?Secondary Diagnosis: ?Patient Active Problem List  ? Diagnosis Date Noted  ? Normal labor 07/26/2021  ? History of macrosomia in infant in prior pregnancy, currently pregnant 04/18/2021  ? Hx of postpartum hemorrhage, currently pregnant, third trimester 04/18/2021  ? 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 Encounter for supervision of normal pregnancy in third trimester 12/12/2020  ? Supervision of other normal pregnancy, antepartum 03/01/2019  ? GAD (generalized anxiety disorder) 12/04/2018  ? Cervical radiculopathy 07/13/2016  ? GERD without esophagitis 07/09/2015  ? Migraine without aura and without status migrainosus, not intractable 07/09/2015  ? ? ?Augmentation: N/A ?Complications: None ?Intrapartum complications/course: [name] presented to L&D in active labor.  She was expectantly managed and progressed well to C/C/+2 with a strong urge to push.  She pushed over approximately 4 minutes for a quick spontaneous birth standing at the bedside.   ?Delivery Type: spontaneous vaginal  delivery ?Anesthesia: None ?Placenta: spontaneous ?Laceration: 1st degree vaginal laceration, hemostatic, no repair  ?Episiotomy: none ?Newborn Data: ?Live born female "Harrold Donath" ?Birth Weight: 8 lb 7.5 oz (3840 g) ?APGAR: 7, 9 ? ?Newborn Delivery   ?Birth date/time: 07/26/2021 13:12:00 ?Delivery type: Vaginal, Spontaneous ?  ?  ?Postpartum Procedures: none ?Edinburgh:  ? ?  07/26/2021  ?  7:40 PM 10/27/2019  ?  9:00 AM 10/25/2019  ?  4:00 PM  ?Inocente Salles Postnatal Depression Scale Screening Tool  ?I have been able to laugh and see the funny side of things. 0 0 0  ?I have looked forward with enjoyment to things. 0 0 0  ?I have blamed myself unnecessarily when things went wrong. 0 2 2  ?I have been anxious or worried for no good reason. 2 1 2   ?I have felt scared or panicky for no good reason. 0 0 1  ?Things have been getting on top of me. 1 0 2  ?I have been so unhappy that I have had difficulty sleeping. 0 0 0  ?I have felt sad or miserable. 0 0 1  ?I have been so unhappy that I have been crying. 0 0 0  ?The thought of harming myself has occurred to me. 0 0 0  ?Edinburgh Postnatal Depression Scale Total 3 3 8   ?  ? ?Post partum course:  ?Patient had an uncomplicated postpartum course.  By time of discharge on PPD#1, her pain was controlled on oral pain medications; she had appropriate lochia and was ambulating, voiding without difficulty and tolerating regular diet.  She was deemed stable for discharge to home.   ? ? ?Discharge Physical Exam:  ?BP 107/73 (BP Location: Left Arm)   Pulse 95   Temp 98.4 ?F (36.9 ?C) (Oral)  Resp 18   Ht 5\' 8"  (1.727 m)   Wt 86.2 kg   SpO2 100%   Breastfeeding Unknown   BMI 28.89 kg/m?  ? ?General: NAD ?CV: RRR ?Pulm: CTABL, nl effort ?ABD: s/nd/nt, fundus firm and below the umbilicus ?Lochia: moderate ?Perineum: minimal edema/repair well approximated ? ?DVT Evaluation: LE non-ttp, no evidence of DVT on exam. ? ?Hemoglobin  ?Date Value Ref Range Status  ?07/27/2021 9.5 (L) 12.0 - 15.0  g/dL Final  ?07/29/2021 21/19/4174 11.1 - 15.9 g/dL Final  ? ?HCT  ?Date Value Ref Range Status  ?07/27/2021 30.0 (L) 36.0 - 46.0 % Final  ? ?Hematocrit  ?Date Value Ref Range Status  ?02/28/2019 38.3 34.0 - 46.6 % Final  ? ? ? ?Disposition: stable, discharge to home. ?Baby Feeding: breastmilk ?Baby Disposition: home with mom ? ?Rh Immune globulin given: Rh pos ?Rubella vaccine given: Immune ?Varivax vaccine given: Immune ?Flu vaccine given in AP or PP setting: declined  ?Tdap vaccine given in AP or PP setting: declined  ? ?Contraception: diaphragm  ? ?Prenatal Labs:  ?Blood type/Rh O pos  ?Antibody screen neg  ?Rubella Immune  ?Varicella Immune  ?RPR NR  ?HBsAg Neg  ?HIV NR  ?GC neg  ?Chlamydia neg  ?Genetic screening cfDNA negative   ?1 hour GTT 155  ?3 hour GTT 90, 128, 139, 90  ?GBS neg  ? ? ?Plan:  ?OREATHA FABRY was discharged to home in good condition. ?Follow-up appointment with delivering provider in 2 weeks. ? ?Discharge Medications: ?Allergies as of 07/27/2021   ?No Known Allergies ?  ? ?  ?Medication List  ?  ? ?TAKE these medications   ? ?ferrous sulfate 325 (65 FE) MG tablet ?Take 1 tablet (325 mg total) by mouth 2 (two) times daily with a meal. ?  ?gabapentin 100 MG capsule ?Commonly known as: NEURONTIN ?Take 1 capsule (100 mg total) by mouth 3 (three) times daily for 14 days. ?  ?levothyroxine 112 MCG tablet ?Commonly known as: SYNTHROID ?TAKE 1 TABLET BY MOUTH ONCE DAILY 3O TO 60 MINUTES BEFORE BREAKFAST ON AN EMPTY STOMACH WITH A FULL GLASS OF WATER ?  ?levothyroxine 112 MCG tablet ?Commonly known as: SYNTHROID ?Take 1 tablet (112 mcg total) by mouth daily at 6 (six) AM. ?  ?PRENATAL VITAMIN AND MINERAL PO ?  ? ?  ? ? ? Follow-up Information   ? ? 07/29/2021, CNM. Schedule an appointment as soon as possible for a visit in 2 week(s).   ?Specialty: Certified Nurse Midwife ?Why: postpartum mood check ?Contact information: ?[address] ?[place] ?[phone] ? ? ?  ?  ? ? [phone], CNM. Schedule an appointment as soon as possible for a visit in 6 week(s).   ?Specialty: Certified Nurse Midwife ?Why: postpartum visit ?Contact information: ?[address] ?[place] ?[phone] ? ? ?  ?  ? ?  ?  ? ?  ? ? ?Signed: ?[phone] ?07/27/2021 12:52 PM ? ? ?

## 2021-07-26 NOTE — Lactation Note (Signed)
This note was copied from a baby's chart. ?Lactation Consultation Note ? ?Patient Name: Katherine Graham ?Today's Date: 07/26/2021 ?Reason for consult: L&D Initial assessment;Term (Mom with history of PPH and low milk supply with first child. Mom had attempted breastfeeding, had outpatient LC assistance. But unfortunately mom was unable to reach her breastfeeding goal due to low milk supply.) ?Age:35 hours ? ?Maternal Data ?Does the patient have breastfeeding experience prior to this delivery?: Yes ?How long did the patient breastfeed?: 3 weeks ? ?Feeding ?Mother's Current Feeding Choice: Breast Milk ? ?LATCH Score ?Latch: Repeated attempts needed to sustain latch, nipple held in mouth throughout feeding, stimulation needed to elicit sucking reflex. ? ?Audible Swallowing: A few with stimulation ? ?Type of Nipple: Everted at rest and after stimulation ? ?Comfort (Breast/Nipple): Soft / non-tender ? ?Hold (Positioning): Assistance needed to correctly position infant at breast and maintain latch. ? ?LATCH Score: 7 ? ? ? ?Interventions ?Interventions: Breast feeding basics reviewed;Breast compression;Adjust position;Support pillows;Position options;Education Encouraged mom to offer baby to breast whenever baby shows feeding cues. ? ? ?Consult Status ?Consult Status: Follow-up from L&D ? ? ? ?Fuller Song ?07/26/2021, 3:57 PM ? ? ? ?

## 2021-07-26 NOTE — H&P (Signed)
OB History & Physical  ? ?History of Present Illness:  ? ?Chief Complaint: contractions  ? ?HPI:  ?Katherine Graham is a 36 y.o. G41P1011 female at [redacted]w[redacted]d dated by [place] at [redacted]w[redacted]d, not c/w LMP of 10/15/2020.  She presents to L&D for regular, painful contractions ? ?Reports active fetal movement  ?Contractions: every 2 to 3 minutes ?LOF/SROM: denies  ?Vaginal bleeding: bloody show ? 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 ?Patient Active Problem List  ? Diagnosis Date Noted  ? Normal labor 07/26/2021  ? Post-term pregnancy, 40-42 weeks of gestation 10/24/2019  ? Nausea/vomiting in pregnancy 09/24/2019  ? Pregnancy 09/24/2019  ? Decreased fetal movement 06/01/2019  ? Supervision of other normal pregnancy, antepartum 03/01/2019  ? GAD (generalized anxiety disorder) 12/04/2018  ? Cervical radiculopathy 07/13/2016  ? GERD without esophagitis 07/09/2015  ? Migraine without aura and without status migrainosus, not intractable 07/09/2015  ? ? ? ?Maternal Medical History:  ? ?Past Medical History:  ?Diagnosis Date  ? Hypothyroidism   ? ? ?Past Surgical History:  ?Procedure Laterality Date  ? INDUCED ABORTION    ? Age 26  ? TONSILLECTOMY    ? ? ?No Known Allergies ? ?Prior to Admission medications   ?Medication Sig Start Date End Date Taking? Authorizing Provider  ?loratadine (CLARITIN) 10 MG tablet Take 10 mg by mouth daily.   Yes [provider]  ?magnesium oxide (MAG-OX) 400 MG tablet Take 400 mg by mouth daily.   Yes [provider]  ?Prenatal Vit-Fe Fumarate-FA (PRENATAL VITAMIN AND MINERAL PO)  08/22/18  Yes [provider]  ?vitamin  B-12 (CYANOCOBALAMIN) 500 MCG tablet Take 500 mcg by mouth daily.   Yes [provider]  ?acetaminophen (TYLENOL) 325 MG tablet Take 2 tablets (650 mg total) by mouth every 6 (six) hours as needed (for pain scale < 4). 10/27/19   Haroldine Laws, CNM  ?ferrous sulfate 325 (65 FE) MG tablet Take 1 tablet (325 mg total) by mouth 2 (two) times daily with a meal. ?Patient not taking: Reported on 07/26/2021 10/27/19   Haroldine Laws, CNM  ?gabapentin (NEURONTIN) 100 MG capsule Take 1 capsule (100 mg total) by mouth 3 (three) times daily for 14 days. 10/27/19 11/10/19  Haroldine Laws, CNM  ?ibuprofen (ADVIL) 600 MG tablet Take 1 tablet (600 mg total) by mouth every 6 (six) hours as needed. 10/27/19   Haroldine Laws, CNM  ?levothyroxine (SYNTHROID) 112 MCG tablet Take 1 tablet (112 mcg total) by mouth daily at 6 (six) AM. 10/28/19 12/27/19  Haroldine Laws, CNM  ?levothyroxine (SYNTHROID) 112 MCG tablet TAKE 1 TABLET BY MOUTH ONCE DAILY 3O TO 60 MINUTES BEFORE BREAKFAST ON AN EMPTY STOMACH WITH A FULL GLASS OF WATER 05/29/19 05/28/20  Otelia Sergeant, NP  ?Probiotic Product (PROBIOTIC-10 ULTIMATE) CAPS Take 1 capsule by mouth daily. ?Patient not taking: Reported on 07/26/2021    [provider]  ? ? ? ?Prenatal care site:  ?[facility] ? ?Social History: She  reports that she has never smoked. She has never used smokeless tobacco. She reports that she does not currently use alcohol. She reports that she does not use drugs. ? ?  Family History: family history includes Colon cancer (age of onset: 9) in her maternal grandmother.  ? ?Review of Systems: A full review of systems was performed and negative except as noted in the HPI.   ? ? ?Physical Exam:  ?Vital Signs: There were no vitals taken for this visit. ?Physical Exam ? ?General: no acute distress.  ?HEENT: normocephalic, atraumatic ?Heart: regular rate & rhythm.  No murmurs/rubs/gallops ?Lungs: clear to auscultation bilaterally, normal respiratory  effort ?Abdomen: soft, gravid, non-tender;  EFW: 8 lbs  ?Pelvic:  ? External: Normal external female genitalia ? Cervix: Dilation: 6 / Effacement (%): 90 / Station: 0  ?  ?Extremities: non-tender, symmetric, No edema bilaterally.  DTRs: 2+/2+  ?Neurologic: Alert & oriented x 3.   ? ?No results found for this or any previous visit (from the past 24 hour(s)). ? ?Pertinent Results:  ?Prenatal Labs: ?Blood type/Rh O pos  ?Antibody screen neg  ?Rubella Immune  ?Varicella Immune  ?RPR NR  ?HBsAg Neg  ?HIV NR  ?GC neg  ?Chlamydia neg  ?Genetic screening cfDNA negative   ?1 hour GTT 155  ?3 hour GTT 90, 128, 139, 90  ?GBS neg  ? ?FHT:  FHR: 130 bpm, variability: moderate,  accelerations:  Present,  decelerations:  Absent ?Category/reactivity:  Category I ?UC:   regular, every 2-3 minutes ?  ?Cephalic by Le[place] and SVE  ? ?No results found. ? ?Assessment:  ?Katherine Graham is a 36 y.o. G86P1011 female at [redacted]w[redacted]d with active labor.  ? ?Plan:  ?1. Admit to Labor & Delivery; consents reviewed and obtained ?- Covid admission screen  ? ?2. Fetal Well being  ?- Fetal Tracing: cat 1 ?- Group B Streptococcus ppx not indicated: GBS neg ?- Presentation: cephalic confirmed by SVE  ? ?3. Routine OB: ?- Prenatal labs reviewed, as above ?- Rh pos ?- CBC, T&S, RPR on admit ?- Reg diet  ?- Declines saline lock at this time.  Risks/beneifts of IV along with indications particularly related to her hx of PPH were discussed.  Consents to active management of 4th stage of labor.  Open to IV at some point but declines at this time. ? ?4. Monitoring of labor  ?- Contractions monitored with external toco ?- Pelvis adequate for trial of labor  ?- Plan for expectant management  ?- Augmentation with AROM as appropriate  ?- Plan for  intermittent fetal monitoring  ?- Maternal pain control as desired; planning low intervention birth options  ?- Anticipate vaginal delivery ? ?5. Post Partum Planning: ?- Infant feeding: Breast  ?- Contraception: diaphragm ?-  Tdap vaccine: declined  ?- Flu vaccine: declined  ? ?Gustavo Lah, CNM ?07/26/21 ?11:25 AM ? ?Margaretmary Eddy, CNM ?Certified Nurse Midwife ?[facility] ?[facility]  ? ? ? ?

## 2021-07-27 LAB — CBC
HCT: 30 % — ABNORMAL LOW (ref 36.0–46.0)
Hemoglobin: 9.5 g/dL — ABNORMAL LOW (ref 12.0–15.0)
MCH: 27.3 pg (ref 26.0–34.0)
MCHC: 31.7 g/dL (ref 30.0–36.0)
MCV: 86.2 fL (ref 80.0–100.0)
Platelets: 267 10*3/uL (ref 150–400)
RBC: 3.48 MIL/uL — ABNORMAL LOW (ref 3.87–5.11)
RDW: 15.1 % (ref 11.5–15.5)
WBC: 12.2 10*3/uL — ABNORMAL HIGH (ref 4.0–10.5)
nRBC: 0 % (ref 0.0–0.2)

## 2021-07-27 MED ORDER — IBUPROFEN 600 MG PO TABS
600.0000 mg | ORAL_TABLET | Freq: Four times a day (QID) | ORAL | Status: DC
  Administered 2021-07-27: 600 mg via ORAL
  Filled 2021-07-27: qty 1

## 2021-07-27 NOTE — Progress Notes (Signed)
Mother discharged.  Discharge instructions given.  Mother verbalizes understanding.  Transported by auxiliary.  

## 2021-07-27 NOTE — Lactation Note (Signed)
This note was copied from a baby's chart. ?Lactation Consultation Note ? ?Patient Name: Katherine Graham ?Today's Date: 07/27/2021 ?Reason for consult: Follow-up assessment ?Age:35 hours ? ?Maternal Data ? ?Please see initial consult note 07/26/21. ?Met with parents today. Per mom's report baby is latching and becoming more consistent with latching and feeding. Mom feeling comfortable with position and latch technique.Baby with recent circumcision. Discussed with parents if baby returns awake and alert to offer baby to breastfeed. Discussed baby may have a sleepy period post circumcision. Recommended mom observe for any feeding cues and offer baby to breast whenever baby elicits cues. ? ? ?Feeding ?Mother's Current Feeding Choice: Breast Milk ? ? ?Interventions ?Interventions: Education ? ?Discharge ? Parents report mom and baby may discharge today. Baby to room from circ awake and alert. Mom will breastfeed and LC will return to review Enjoy mother-baby book prior to discharge. ? ?Consult Status ?Consult status:PRN ? ? ? ?Jonna Paysen Goza ?07/27/2021, 2:00 PM ? ? ? ?

## 2021-08-12 ENCOUNTER — Ambulatory Visit: Payer: Self-pay

## 2021-08-12 NOTE — Lactation Note (Signed)
This note was copied from a baby's chart. ?Lactation Consultation Note ? ?Patient Name: Katherine Graham ?Today's Date: 08/12/2021 ?  ?Age:35 wk.o. ?BW:  8.75 or 3840 gm, wt at [facility] on 4/4 was approx 7.8 lbs per parents, parents were encouraged to supplement feeds with 2 oz of formula after breastfeeding, mom has not been doing this, wants a 2nd opinion, wt today was 7.10/3350 gm nude and then 7.06 or 3370gm with diaper before feeding, this is a 12% wt loss, baby voiding 6-8 + wt diaper a day and is having multiple yellow seedy stools per day. Skin turgor is decreased and skin is wrinkly and dry, baby has ingrown fingernail on left index finger that has dark greenish scab on edge of nail and skin is pink, ped recommended mom  to use warm compresses on this but she has not started this ?Maternal Data ? Breastfed first child x 2-3 wks, had low milk supply due to [facility] x2 and then formula fed.   ? ?Feeding ? Baby noted to have a mild tongue tie but is able to advance tongue over gum line, mom states the Ped has observed this, her first child had a tongue tie also.  BAby latches easily to right breast first, some swallows noted but baby pulls off frequently at first, then settles, then needs much stimulation to continue to nurse, nursed approx 20 min, burped and latched to left breast, nursed approx 10 min with some swallows but much more sleepy on this side, wt after feeding was 7.08 or 3400 gm.  This showed an intake of 30cc of breast milk at breast, mom pumped breasts after with a medela electric pump and obtained 8cc.  THis was fed to baby with paced bottle feed.     ? ?LATCH Score ?  ? ?  ? ?  ? ?  ? ?  ? ?  ? ? ?Lactation Tools Discussed/Used ? Pump breasts after every breastfeeding to increase milk supply, recommend just pumping breasts only at next feeding and feeding formula instead, refrigerate EBM until next feeding.   ? ?Interventions ? Recommend mom to formula feed baby at least 2.5 oz of formula at next feed  and not breastfeed to not tire baby and burn more calories, pump breasts only., then possibly breastfeed every other feed, depending on baby's stamina at breast, when she does breastfeed baby recommend supplementing baby after with at least 1.5 oz of EBM/formula.  Mom informed that milk increase may not happen until 48-72 hrs with pumping breasts q 3h.  Inefficiency at breast of baby nursing and possibly tongue tie could be contributing to mom's low supply.  This will need to be re-evaluated once baby begins to gain wt and obtains more stamina, SCN nurse examined baby's finger, may soak finger in warm water to soften scab and possibly swab with EBM to soften, if finger not better or becomes more inflamed, call ped.,  Mom given handouts on dentists who perform tongue tie revisions.    ?Milk intake worksheet used to determine amt of supplement needed per feeding ?Discharge ? Has Ped appt in 2 wks ? ?Consult Status ? Wt check with lactation next Tues, April 11 at 1400 to check on progress and assess supplementation   ? ? ? ?Dyann Kief ?08/12/2021, 6:24 PM ? ? ? ?

## 2021-08-12 NOTE — Lactation Note (Signed)
This note was copied from a baby's chart. ?Lactation Consultation Note ? ?Patient Name: Katherine Graham ?Today's Date: 08/12/2021 ?  ?Age:35 wk.o. ?BW:  8.75 or 3840 gm, wt at [facility] on 4/4 was approx 7.8 lbs per parents, parents were encouraged to supplement feeds with 2 oz of formula after breastfeeding, mom has not been doing this, wants a 2nd opinion, wt today was 7.10/3350 gm nude and then 7.06 or 3370gm with diaper before feeding, this is a 12% wt loss, baby voiding 6-8 + wt diaper a day and is having multiple yellow seedy stools per day. Skin turgor is decreased and skin is wrinkly and dry, baby has ingrown fingernail on left index finger that has dark greenish scab on edge of nail and skin is pink, ped recommended mom  to use warm compresses on this but she has not started this ?Maternal Data ? Breastfed first child x 2-3 wks, had low milk supply due to [facility] x2 and then formula fed.   ? ?Feeding ? Baby noted to have a mild tongue tie but is able to advance tongue over gum line, mom states the Ped has observed this, her first child had a tongue tie also.  BAby latches easily to right breast first, some swallows noted but baby pulls off frequently at first, then settles, then needs much stimulation to continue to nurse, nursed approx 20 min, burped and latched to left breast, nursed approx 10 min with some swallows but much more sleepy on this side, wt after feeding was 7.08 or 3400 gm.  This showed an intake of 30cc of breast milk at breast, mom pumped breasts after with a medela electric pump and obtained 8cc.  THis was fed to baby with paced bottle feed.     ? ?LATCH Score ?  ? ?  ? ?  ? ?  ? ?  ? ?  ? ? ?Lactation Tools Discussed/Used ? Pump breasts after every breastfeeding to increase milk supply, recommend just pumping breasts only at next feeding and feeding formula instead, refrigerate EBM until next feeding.   ? ?Interventions ? Recommend mom to formula feed baby at least 2.5 oz of formula at next feed  and not breastfeed to not tire baby and burn more calories, pump breasts only., then possibly breastfeed every other feed, depending on baby's stamina at breast, when she does breastfeed baby recommend supplementing baby after with at least 1.5 oz of EBM/formula.  Mom informed that milk increase may not happen until 48-72 hrs with pumping breasts q 3h.  Inefficiency at breast of baby nursing and possibly tongue tie could be contributing to mom's low supply.  This will need to be re-evaluated once baby begins to gain wt and obtains more stamina, SCN nurse examined baby's finger, may soak finger in warm water to soften scab and possibly swab with EBM to soften, if finger not better or becomes more inflamed, call ped.,  Mom given handouts on dentists who perform tongue tie revisions.    ?Milk intake worksheet used to determine amt of supplement needed per feeding ?Discharge ? Has Ped appt in 2 wks ? ?Consult Status ? Wt check with lactation next Tues, April 11 at 1400 to check on progress and assess supplementation   ? ?This noted was faxed to ped at [facility] ? ?Ferol Luz ?08/12/2021, 6:53 PM ? ? ? ?

## 2021-08-16 ENCOUNTER — Ambulatory Visit: Payer: Self-pay

## 2021-08-16 NOTE — Lactation Note (Signed)
This note was copied from a baby's chart. ?Lactation Consultation Note ? ?Patient Name: Katherine Graham ?Today's Date: 08/16/2021 ?Reason for consult: Other (Comment) (Outpatient) ?Age:35 wk.o. ? ?Follow-up lactation service appointment/weight check. ? ?Maternal Data ?Has patient been taught Hand Expression?: Yes ?Does the patient have breastfeeding experience prior to this delivery?: Yes ?How long did the patient breastfeed?: 2-3 weeks ? ?Feeding ?Mother's Current Feeding Choice: Breast Milk ? ?Mom reports triple feeding for the last 3 days following first outpatient appointment.  ?Today's visit was initially for weight check, however it was feeding time so weight did a full feeding assessment. ?Pre-Feed weight (just diaper): 3660 ?This shows a weight gain of 260g since visit on Friday. ?End weight (just diaper): 3696 ? ?LATCH Score ?Latch: Grasps breast easily, tongue down, lips flanged, rhythmical sucking. ? ?Audible Swallowing: Spontaneous and intermittent ? ?Type of Nipple: Everted at rest and after stimulation ? ?Comfort (Breast/Nipple): Filling, red/small blisters or bruises, mild/mod discomfort (tongue tie) ? ?Hold (Positioning): No assistance needed to correctly position infant at breast. ? ?LATCH Score: 9 ? ?Baby does well at the breast, latches easy without help, flanged upper/bottom lip, and sustains latch despite tongue tie. Loud audible swallows noted. Little to no nipple damage upon release; nipple does look somewhat pinched but releases quickly. ? ?Lactation Tools Discussed/Used ?  ? ?Interventions ?Interventions: Breast feeding basics reviewed;Support pillows;Expressed milk;DEBP;Education;Pace feeding ? ?LC team Katherine Graham and Katherine Bible) monitored the feeding. Total of 56mL transferred from R breast and 24mL transferred from the L. L breast was noticeably smaller and less developed than the R breast, possibly with less milk removal since baby does well on R breast(?) ?We reviewed current feeding plan- mom  states that process is taking 1hr if not a little more than 1hr.  ?Upon monitoring LC's provided guidance to limit feeding to R breast for 20 minutes (baby's feeding time this visit), supplement with needed volume (minimum 1.5oz), and to pump (double pump) breasts. We encouraged feeding plan/process to be minimized to no more than 30-40 minutes to help mom achieve time for rest as well. ? ?Mom requested for LC's to check her DEBP (medela sonata) to ensure proper fit flanges. Mom was using size 22mm on R and size 52mm on the L- The flange on the L was determined to be too small, mom was given size 7mm and mom felt more comfortable. Reviewed suction level to mom's comfort, use of coconut oil to minimize friction and improve comfort with pumping. Encouraged hand expression pre/post pumping as well for extra milk removal. ? ? ?Discharge ?Discharge Education: Outpatient recommendation (Feeding plan) ?Pump: Personal Gaffer) ? ?Consult Status ?Consult Status: Complete ? ?Baby has follow-up visit with [facility] next Friday 08/26/21. Possible follow-up needed, but mom was praised with the progress baby had today and was encouraged with new feeding plan. ? ?Katherine Graham ?08/16/2021, 1:58 PM ? ? ? ?

## 2023-02-12 ENCOUNTER — Other Ambulatory Visit: Payer: Self-pay | Admitting: Certified Nurse Midwife

## 2023-02-12 DIAGNOSIS — Z3482 Encounter for supervision of other normal pregnancy, second trimester: Secondary | ICD-10-CM

## 2023-03-12 ENCOUNTER — Encounter: Payer: Self-pay | Admitting: *Deleted

## 2023-03-14 ENCOUNTER — Other Ambulatory Visit: Payer: Self-pay | Admitting: Obstetrics and Gynecology

## 2023-03-14 ENCOUNTER — Ambulatory Visit (HOSPITAL_BASED_OUTPATIENT_CLINIC_OR_DEPARTMENT_OTHER): Payer: Self-pay

## 2023-03-14 ENCOUNTER — Other Ambulatory Visit: Payer: Self-pay

## 2023-03-14 ENCOUNTER — Other Ambulatory Visit
Admission: RE | Admit: 2023-03-14 | Discharge: 2023-03-14 | Disposition: A | Discharge status: Home / Self Care | Payer: Self-pay | Source: Ambulatory Visit | Attending: Obstetrics and Gynecology | Admitting: Obstetrics and Gynecology

## 2023-03-14 ENCOUNTER — Ambulatory Visit: Payer: Self-pay

## 2023-03-14 DIAGNOSIS — Z3A19 19 weeks gestation of pregnancy: Secondary | ICD-10-CM | POA: Insufficient documentation

## 2023-03-14 DIAGNOSIS — Z3482 Encounter for supervision of other normal pregnancy, second trimester: Secondary | ICD-10-CM | POA: Insufficient documentation

## 2023-03-14 DIAGNOSIS — Z3689 Encounter for other specified antenatal screening: Secondary | ICD-10-CM | POA: Insufficient documentation

## 2023-03-14 DIAGNOSIS — O361920 Maternal care for other isoimmunization, second trimester, not applicable or unspecified: Secondary | ICD-10-CM

## 2023-03-14 DIAGNOSIS — Z363 Encounter for antenatal screening for malformations: Secondary | ICD-10-CM | POA: Insufficient documentation

## 2023-03-14 NOTE — Progress Notes (Signed)
Lab sendout to [facility] for fetal cell free testing for Kell typing

## 2023-03-16 LAB — MISCELLANEOUS TEST

## 2023-03-27 ENCOUNTER — Other Ambulatory Visit: Payer: Self-pay

## 2023-03-28 ENCOUNTER — Telehealth: Payer: Self-pay | Admitting: Obstetrics and Gynecology

## 2023-03-28 NOTE — Telephone Encounter (Signed)
[facility] screening showed the fetus has Kell (K) Antigen. Aneuploidy risks including Down syndrome are not increased.  I called the patient and informed her of the results. Since the fetus is Kell antigen positive, the pregnancy is at risk for fetal hemolysis. Low titers are associated with less likelihood of hemolysis but frequent monitoring (middle-cerebral artery or MCA Doppler) for evidence of fetal anemia is necessary.   I explained that we recommend MCA Doppler every 2 weeks till delivery. Patient prefers longer intervals (every 4 weeks). We will discuss at her next visit when she returns for ultrasound.  I reassured her that she can continue her care with midwifery practice provided there is no evidence of fetal anemia.  Patient understands that only amniocentesis will give a definitive result on the Kell antigen status. She opted not to have amniocentesis.

## 2023-04-02 DIAGNOSIS — O09299 Supervision of pregnancy with other poor reproductive or obstetric history, unspecified trimester: Secondary | ICD-10-CM | POA: Insufficient documentation

## 2023-04-02 DIAGNOSIS — O09529 Supervision of elderly multigravida, unspecified trimester: Secondary | ICD-10-CM | POA: Insufficient documentation

## 2023-04-02 DIAGNOSIS — O34219 Maternal care for unspecified type scar from previous cesarean delivery: Secondary | ICD-10-CM | POA: Insufficient documentation

## 2023-04-02 DIAGNOSIS — O361990 Maternal care for other isoimmunization, unspecified trimester, not applicable or unspecified: Secondary | ICD-10-CM | POA: Insufficient documentation

## 2023-04-02 DIAGNOSIS — E039 Hypothyroidism, unspecified: Secondary | ICD-10-CM | POA: Insufficient documentation

## 2023-04-09 ENCOUNTER — Ambulatory Visit: Payer: Self-pay | Attending: Obstetrics

## 2023-04-09 ENCOUNTER — Other Ambulatory Visit: Payer: Self-pay

## 2023-04-09 ENCOUNTER — Other Ambulatory Visit: Payer: Self-pay | Admitting: Obstetrics

## 2023-04-09 VITALS — BP 119/69 | HR 99 | Temp 98.3°F | Ht 70.8 in | Wt 193.0 lb

## 2023-04-09 DIAGNOSIS — O09299 Supervision of pregnancy with other poor reproductive or obstetric history, unspecified trimester: Secondary | ICD-10-CM

## 2023-04-09 DIAGNOSIS — E039 Hypothyroidism, unspecified: Secondary | ICD-10-CM

## 2023-04-09 DIAGNOSIS — Z3A33 33 weeks gestation of pregnancy: Secondary | ICD-10-CM

## 2023-04-09 DIAGNOSIS — O34219 Maternal care for unspecified type scar from previous cesarean delivery: Secondary | ICD-10-CM

## 2023-04-09 DIAGNOSIS — O361930 Maternal care for other isoimmunization, third trimester, not applicable or unspecified: Secondary | ICD-10-CM | POA: Insufficient documentation

## 2023-04-09 DIAGNOSIS — O361991 Maternal care for other isoimmunization, unspecified trimester, fetus 1: Secondary | ICD-10-CM

## 2023-04-09 DIAGNOSIS — O361920 Maternal care for other isoimmunization, second trimester, not applicable or unspecified: Secondary | ICD-10-CM

## 2023-04-09 DIAGNOSIS — O09529 Supervision of elderly multigravida, unspecified trimester: Secondary | ICD-10-CM

## 2023-04-09 DIAGNOSIS — Z3A23 23 weeks gestation of pregnancy: Secondary | ICD-10-CM | POA: Insufficient documentation

## 2023-05-07 ENCOUNTER — Other Ambulatory Visit: Payer: Self-pay

## 2023-05-07 ENCOUNTER — Other Ambulatory Visit: Payer: Self-pay | Admitting: Obstetrics

## 2023-05-07 ENCOUNTER — Ambulatory Visit: Payer: Self-pay | Attending: Obstetrics and Gynecology

## 2023-05-07 DIAGNOSIS — O361930 Maternal care for other isoimmunization, third trimester, not applicable or unspecified: Secondary | ICD-10-CM

## 2023-05-07 DIAGNOSIS — O361920 Maternal care for other isoimmunization, second trimester, not applicable or unspecified: Secondary | ICD-10-CM

## 2023-05-07 DIAGNOSIS — Z3A27 27 weeks gestation of pregnancy: Secondary | ICD-10-CM | POA: Insufficient documentation

## 2023-06-06 ENCOUNTER — Other Ambulatory Visit: Payer: Self-pay

## 2023-07-05 ENCOUNTER — Ambulatory Visit: Payer: Self-pay | Admitting: Dermatology

## 2023-07-25 ENCOUNTER — Ambulatory Visit: Payer: Self-pay

## 2023-07-25 ENCOUNTER — Ambulatory Visit: Payer: Self-pay | Attending: Obstetrics

## 2023-07-25 VITALS — BP 122/65 | HR 76

## 2023-07-25 DIAGNOSIS — O09523 Supervision of elderly multigravida, third trimester: Secondary | ICD-10-CM

## 2023-07-25 DIAGNOSIS — O361930 Maternal care for other isoimmunization, third trimester, not applicable or unspecified: Secondary | ICD-10-CM

## 2023-07-25 DIAGNOSIS — Z3A38 38 weeks gestation of pregnancy: Secondary | ICD-10-CM

## 2024-05-13 NOTE — Telephone Encounter (Signed)
 After review, MFM consult with provider is not indicated for today  Arna Ranks, MD 05/13/2024 10:53 AM  [facility]
